# Patient Record
Sex: Male | Born: 1955
Health system: Southern US, Community
[De-identification: ages and names within clinical notes are randomized; demographics above are authoritative.]

---

## 2003-09-21 HISTORY — PX: CARDIAC SURGERY: SHX584

## 2006-10-14 ENCOUNTER — Emergency Department (HOSPITAL_COMMUNITY): Admission: EM | Admit: 2006-10-14 | Discharge: 2006-10-14 | Payer: Self-pay | Admitting: Emergency Medicine

## 2009-05-01 ENCOUNTER — Inpatient Hospital Stay (HOSPITAL_COMMUNITY): Admission: RE | Admit: 2009-05-01 | Discharge: 2009-05-04 | Payer: Self-pay | Admitting: Orthopedic Surgery

## 2009-12-25 ENCOUNTER — Encounter: Admission: RE | Admit: 2009-12-25 | Discharge: 2009-12-25 | Payer: Self-pay | Admitting: Orthopedic Surgery

## 2010-07-25 IMAGING — RF DG LUMBAR SPINE COMPLETE 4+V
1 series · 4 of 4 positions shown · non-contrast
Comparison: 04/28/2009

CLINICAL DATA: Foraminal stenosis with advanced arthritis and
radicular left leg pain, T L I F L5-S1

LUMBAR SPINE - COMPLETE 4+ VIEW

[Series 1: run · 4 of 4 slices shown]
[im 1/4]
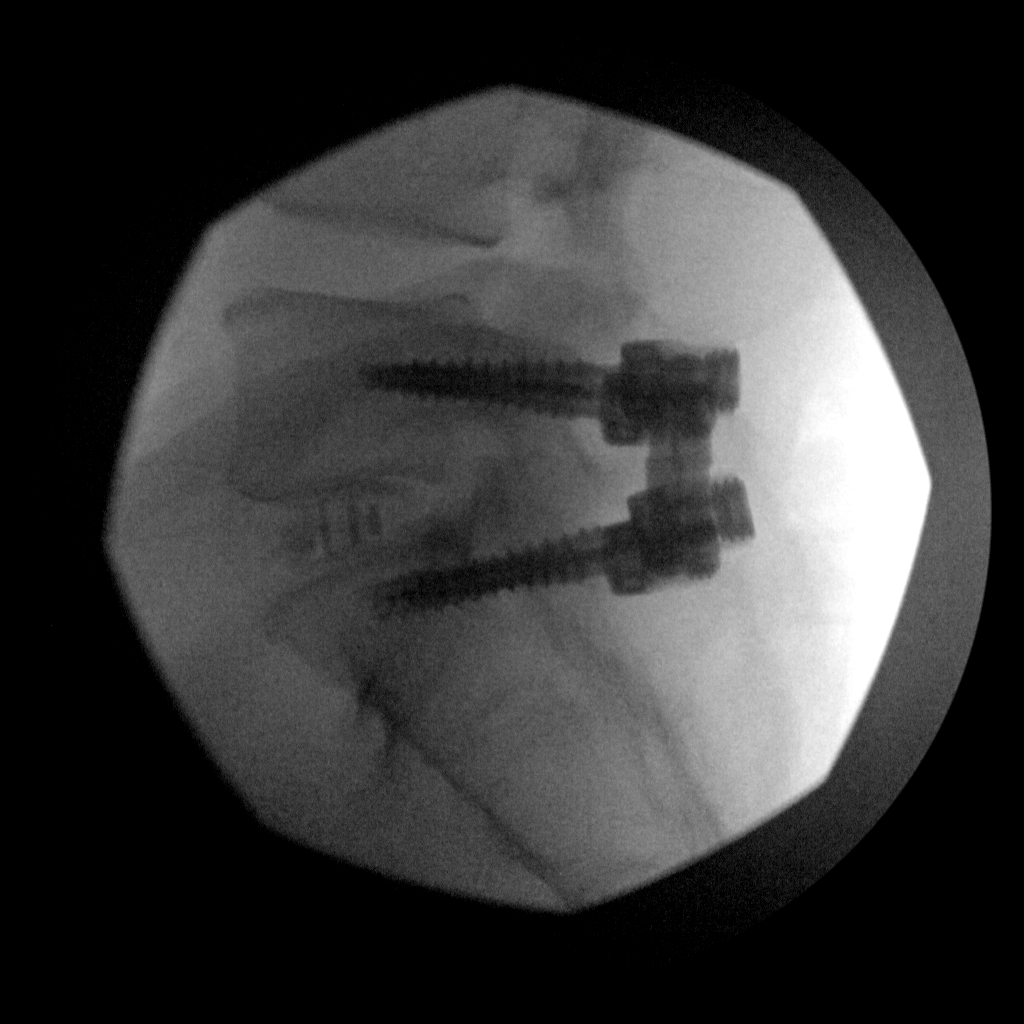
[im 2/4]
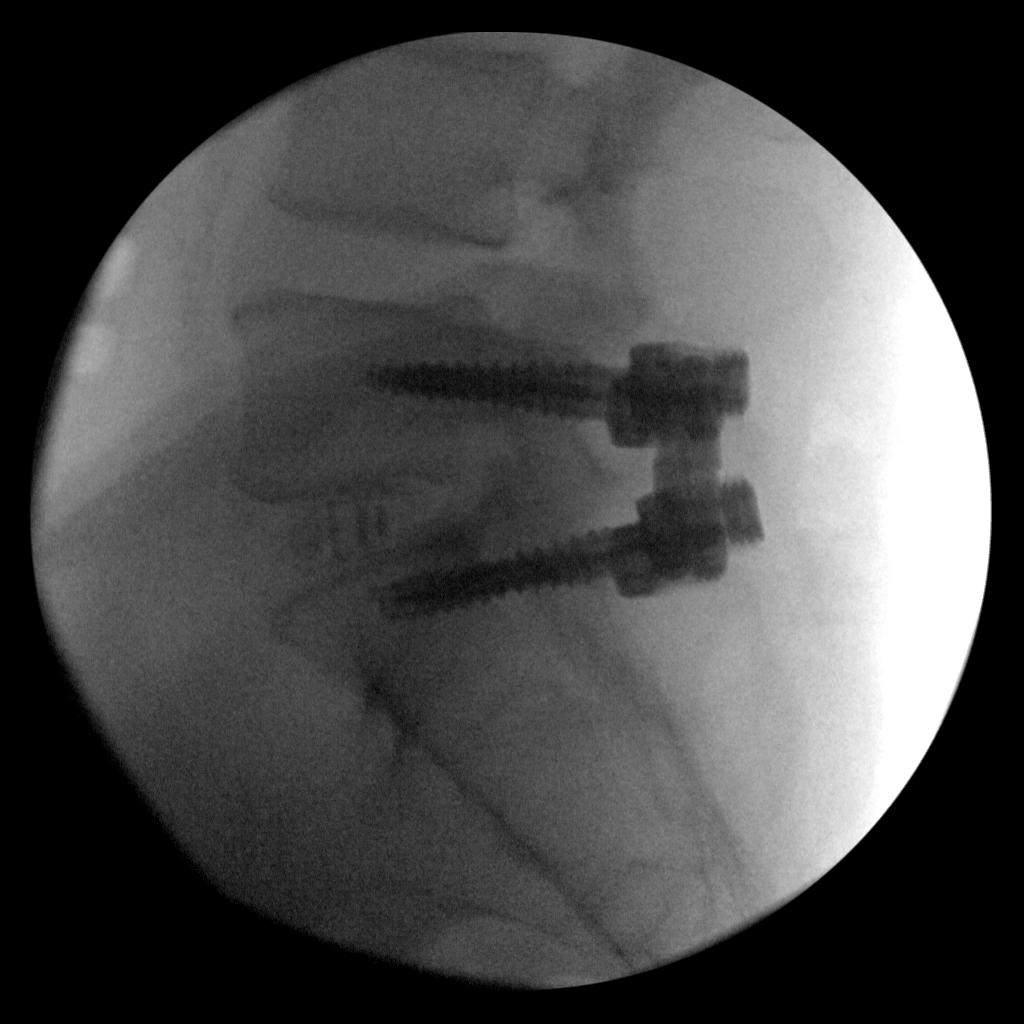
[im 3/4]
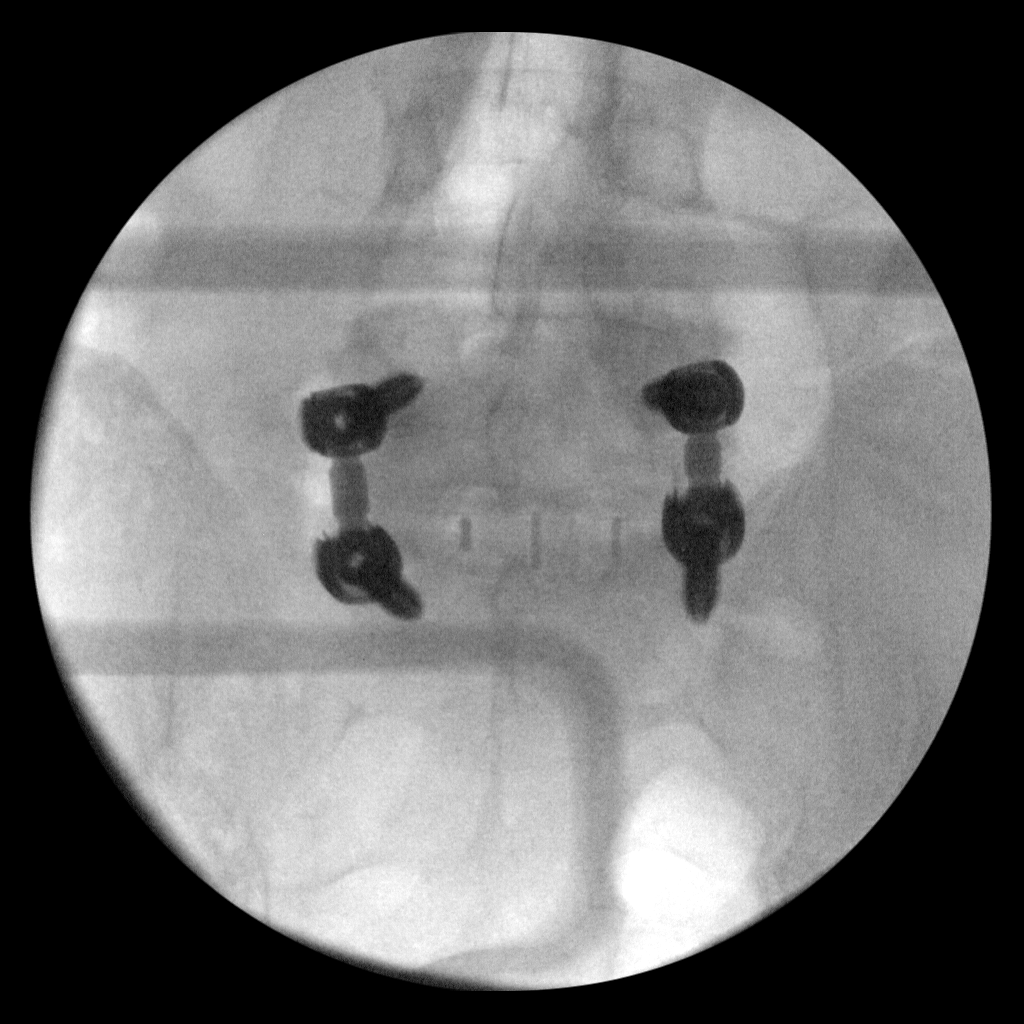
[im 4/4]
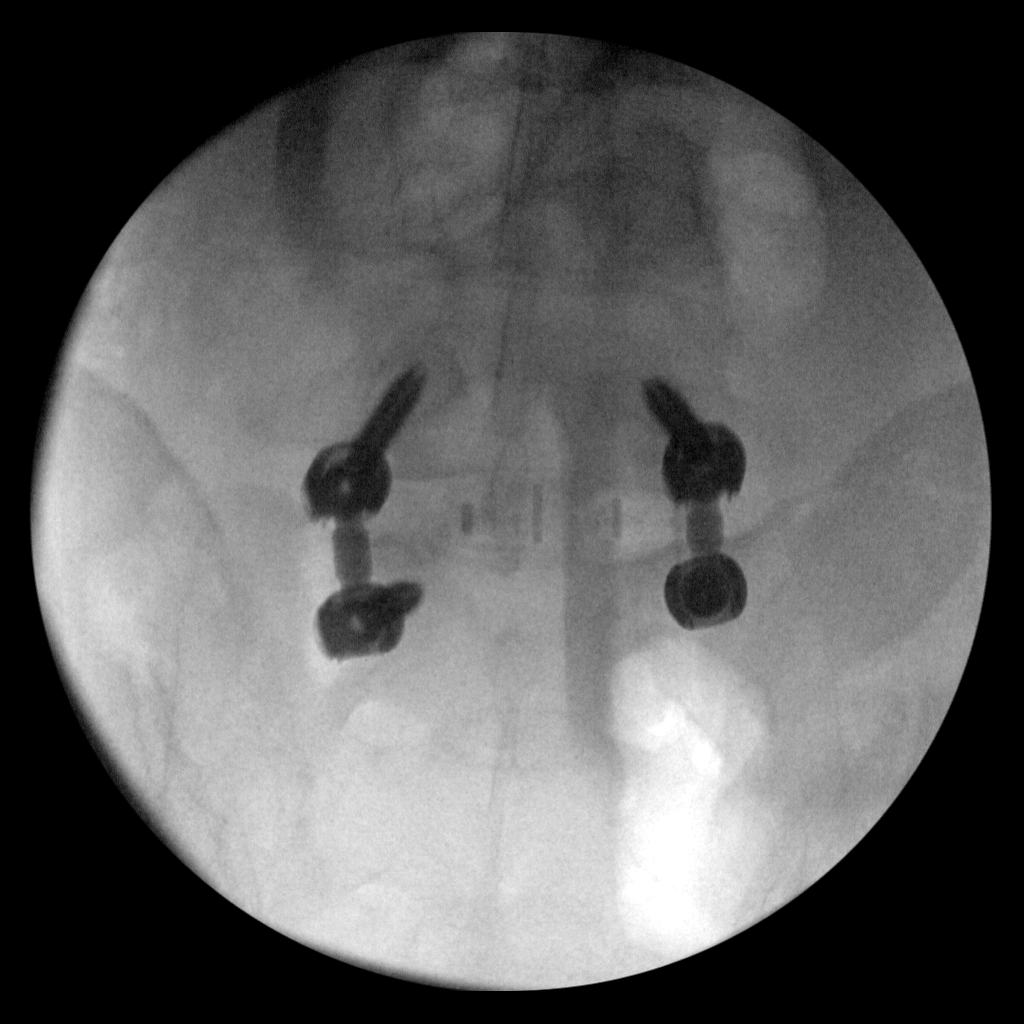

[4 of 4 positions shown; findings below may reference images not displayed]

FINDINGS: Four digital C-arm fluoroscopic images submitted.
Five lumbar vertebrae by preceding plain films of 04/28/2009.
Intraoperative images demonstrate bilateral pedicle screw and
posterior bar placement with disc prosthesis at L5-S1.
No subluxation or acute complication identified.
IMPRESSION: Status post posterior fusion L5-S1.

## 2010-07-26 IMAGING — CR DG LUMBAR SPINE 2-3V
2 series · 2 of 2 positions shown · non-contrast
Comparison: 05/01/2009 intraoperative views.

CLINICAL DATA: Foraminal stenosis with advanced arthritis and
radiculopathy left leg.

LUMBAR SPINE - 2-3 VIEW

[t l-spine a.p.]
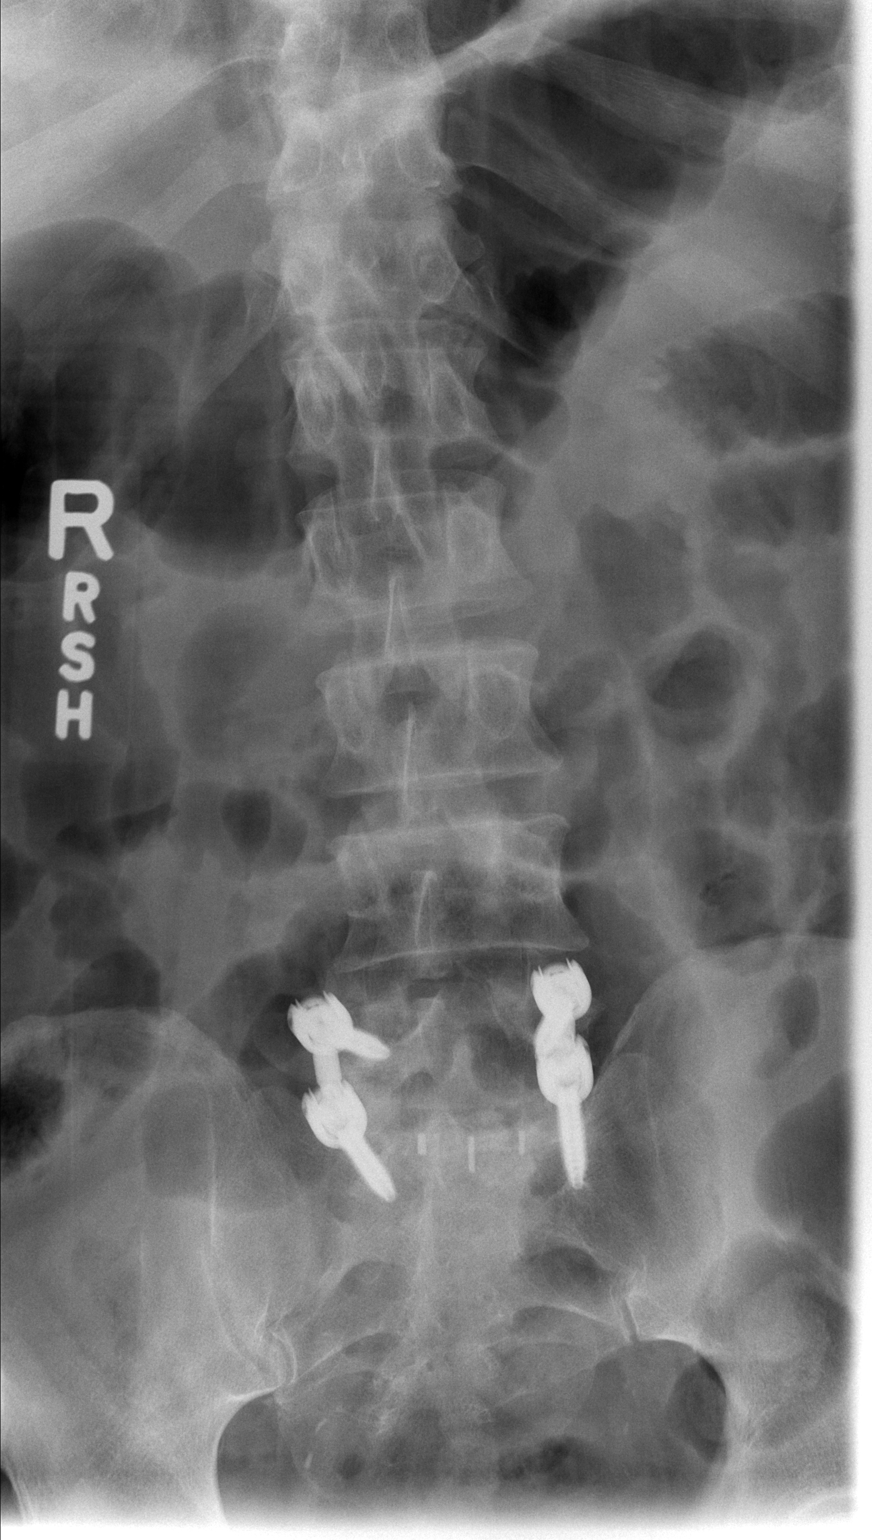

[t l-spine lat]
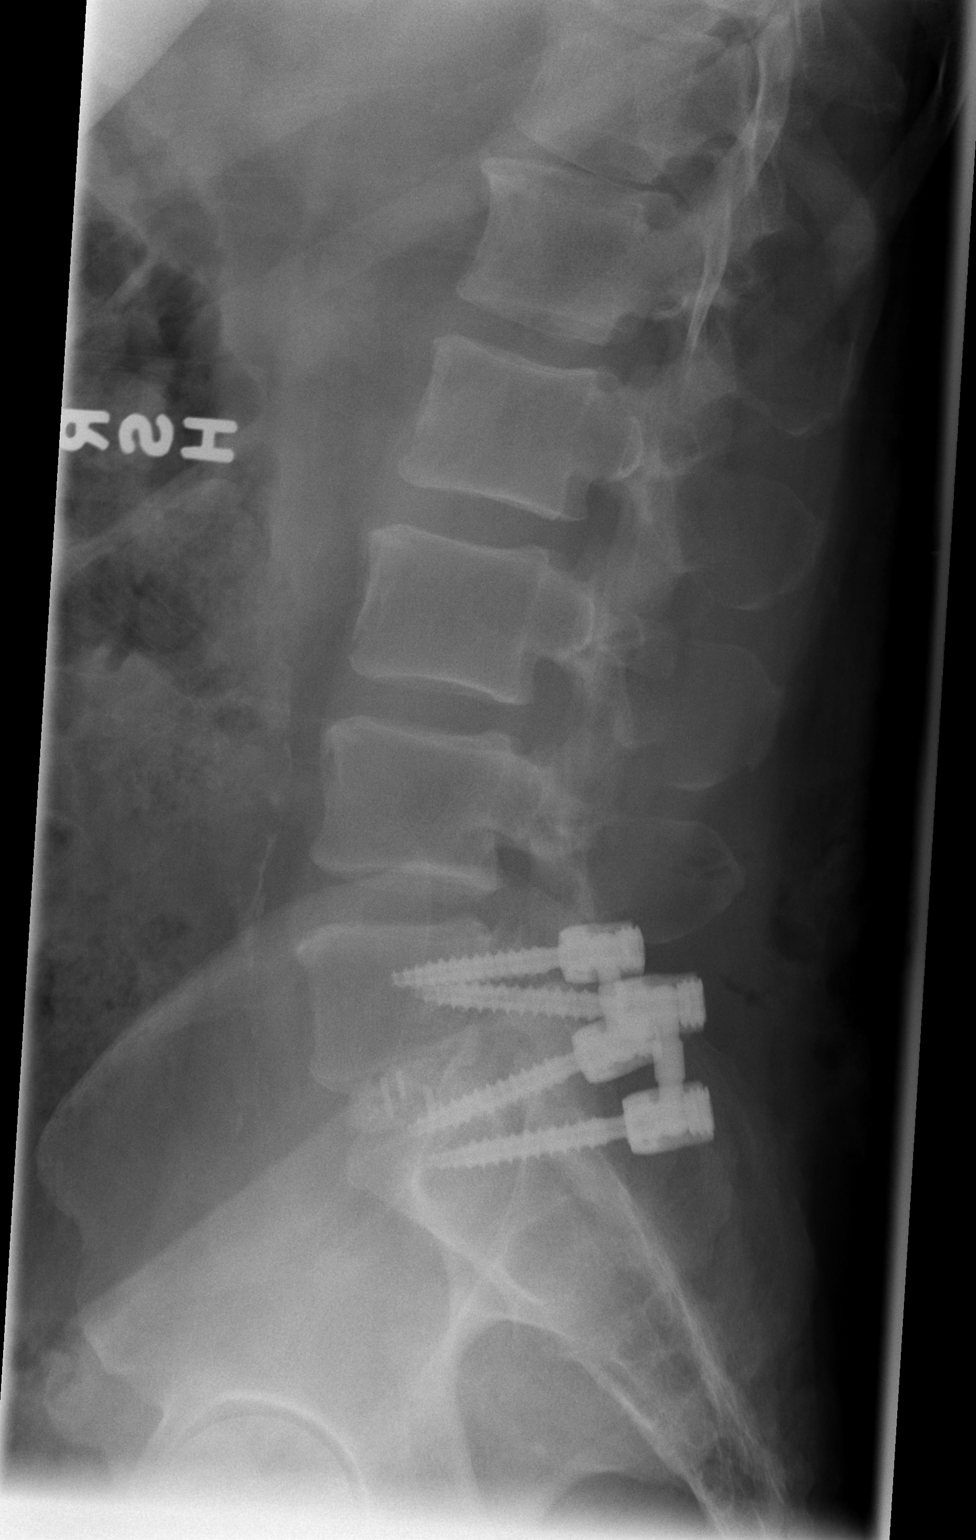

[2 of 2 positions shown; findings below may reference images not displayed]

FINDINGS: Intact paired Transpedicle screws at L5 and S1.
Posterior interconnecting bars intact.  Interbody spacers are
noted.  No spondylolisthesis.
IMPRESSION: Satisfactory postoperative changes at L5-S1.

## 2010-12-26 LAB — CBC
HCT: 40.6 % (ref 39.0–52.0)
HCT: 42.1 % (ref 39.0–52.0)
HCT: 52.4 % — ABNORMAL HIGH (ref 39.0–52.0)
Hemoglobin: 14.8 g/dL (ref 13.0–17.0)
Hemoglobin: 15 g/dL (ref 13.0–17.0)
Hemoglobin: 18 g/dL — ABNORMAL HIGH (ref 13.0–17.0)
MCHC: 34.4 g/dL (ref 30.0–36.0)
MCHC: 34.7 g/dL (ref 30.0–36.0)
MCHC: 34.8 g/dL (ref 30.0–36.0)
MCV: 92.3 fL (ref 78.0–100.0)
Platelets: 103 10*3/uL — ABNORMAL LOW (ref 150–400)
Platelets: 109 10*3/uL — ABNORMAL LOW (ref 150–400)
RBC: 4.62 MIL/uL (ref 4.22–5.81)
RBC: 5.76 MIL/uL (ref 4.22–5.81)
RDW: 13.3 % (ref 11.5–15.5)
RDW: 13.5 % (ref 11.5–15.5)
RDW: 13.7 % (ref 11.5–15.5)
WBC: 10.3 10*3/uL (ref 4.0–10.5)
WBC: 5.6 10*3/uL (ref 4.0–10.5)
WBC: 7.6 10*3/uL (ref 4.0–10.5)
WBC: 8.6 10*3/uL (ref 4.0–10.5)

## 2010-12-26 LAB — BASIC METABOLIC PANEL
BUN: 10 mg/dL (ref 6–23)
BUN: 9 mg/dL (ref 6–23)
CO2: 28 mEq/L (ref 19–32)
Calcium: 9.9 mg/dL (ref 8.4–10.5)
Chloride: 102 mEq/L (ref 96–112)
Creatinine, Ser: 1.02 mg/dL (ref 0.4–1.5)
Creatinine, Ser: 1.04 mg/dL (ref 0.4–1.5)
Creatinine, Ser: 1.18 mg/dL (ref 0.4–1.5)
GFR calc Af Amer: >60 mL/min (ref >60)
GFR calc Af Amer: >60 mL/min (ref >60)
GFR calc non Af Amer: >60 mL/min (ref >60)
GFR calc non Af Amer: >60 mL/min (ref >60)
GFR calc non Af Amer: >60 mL/min (ref >60)
Glucose, Bld: 102 mg/dL — ABNORMAL HIGH (ref 70–99)
Potassium: 3.4 mEq/L — ABNORMAL LOW (ref 3.5–5.1)
Sodium: 136 mEq/L (ref 135–145)

## 2010-12-26 LAB — TYPE AND SCREEN

## 2011-02-02 NOTE — Op Note (Signed)
NAME:  Marco Franco, Marco Franco                  ACCOUNT NO.:  [id number]   MEDICAL RECORD NO.:  [id number]          PATIENT TYPE:  INP   LOCATION:  5005                         FACILITY:  [facility]   PHYSICIAN:  Alvy Beal, MD    DATE OF BIRTH:  06/05/1956   DATE OF PROCEDURE:  DATE OF DISCHARGE:                               OPERATIVE REPORT   PREOPERATIVE DIAGNOSIS:  Degenerative disk disease with radicular left  leg pain.   POSTOPERATIVE DIAGNOSIS:  Degenerative disk disease with radicular left  leg pain.   OPERATIVE PROCEDURE:  Transforaminal lumbar interbody fusion L5-S1.  This is a minimally invasive TLIF with NuVasive minimally invasive  pedicle screw system with interbody fixation.   I used local autograft bone combined with Actifuse.  I used a 10 x 11  interbody spacer with 40-mm pedicle screws.  Intraoperative evoked motor  potentials and running EMGs remained normal throughout the case, no  electrodiagnostic or radiographic evidence of pedicle breach.   FIRST ASSISTANT:  Crissie Reese, PA   HISTORY:  This is a very pleasant 55 year old gentleman who presents  with severe back pain, buttock pain and left leg pain.  Prolonged  attempts at conservative management consisting of physical therapy,  injection therapy, activity modification have failed to alleviate his  symptoms.  Discussion was made to go for a surgical intervention and he  elected to proceed with surgery.  All appropriate risks, benefits and  alternatives to surgery were discussed with the patient and consent was  obtained.   OPERATIVE NOTE:  The patient was brought to the operating room and  placed supine on the operating table.  After successful induction of  general anesthesia and endotracheal intubation, TEDs, SCD and a Foley  were applied.  The patient was turned prone onto a Wilson frame and all  bony prominences were well-padded.  The NuVasive intraoperative neuro-  monitoring devices were attached  (needle electrodes).  After they were  all secured down a test was done and they were working satisfactory.  The back was prepped and draped in a standard fashion.   An x-ray was brought into the wound sterilely and identified the left  lateral aspect of the L5 pedicle.  A stab incision was made and the  trocar was advanced percutaneously down to the lateral aspect of the  facet complex.  I confirmed satisfactory position in the AP and lateral  planes.  I then advanced the pedicle probe through the pedicle just at  the posterior vertebral body junction.  I confirmed satisfactory  trajectory again using AP and lateral fluoroscopy.  I then connected it  to the neuro-monitoring device and using free running EMGs at no point  were there any electrodiagnostic evidence of pedicle breach.  I advanced  the pedicle probe through the posterior vertebral body and halfway into  the vertebral body.  I then removed out the inner sleeve and placed a  guidepin down the trocar and into the vertebral body.  I then removed  the probe entirety.  At this point, I rechecked the x-ray to ensure the  pin did not migrate.  I proceeded with an S1 screw.  Using the same  technique, I made a stab incision on the lateral aspect of the S1  pedicle and advanced the trocar down right at the lateral border of the  facet complex.  Confirming position in the AP and lateral planes, I  advanced the pedicle probe again using free running real time EMG  evaluation through the S1 pedicle.  Just as I was at the junction of the  S1 pedicle and posterior vertebral body of S1, I checked the AP view to  confirm that I had not breached medially, confirmed, I advanced into the  S1 vertebral body.  I then removed the inner sleeve, placed the guidepin  and removed the outer sleeve.  At this point with both sleeves  positioned, I made an incision connecting these two guidepins.  I  sharply dissected down to the deep fascia and that was  sharply incised.   At this point, I advanced the dilating tubes down the guidepin and then  placed the tap.  Again using the free running live EMGs, I advanced the  tap and at no point was there any EMG/neurodiagnostic evidence of  pedicle breach.  Again confirming position and trajectory in the AP and  lateral planes, there was no radiographic evidence of breach.  I then  repeated this at S1 and then placed a screw to the appropriate depth.  Attached to the screw was the side retractor.  At this point with the  screws at appropriate depth and  side retractors, I secured the external  retracting device to the blades.  I then secured this to the table.  I  then distracted the pedicle screws and I had adequate distraction of the  L5-S1 disk space.  I then placed the medial blade into the correct  position.  At this point, I had an adequate exposure to the lateral  aspect of the L5-S1 joint space.   At this point in time, I identified the posterior aspect of the S1  lamina and used a fine curette to develop a plane underneath it.  I then  did a complete S1 laminectomy and a partial L5 laminectomy.  At this  point, I could then get a nerve hook underneath the ligamentum flavum  and I resected the ligamentum flavum to expose the lateral aspect of the  thecal sac.  At this point, I used an osteotome to remove the inferior  L5 facet complex.  This was morselized for later bone graft use.   With the inferior L5 facet removed, I then used a 3-mm Kerrison to  resect the superior and medial portion of the S1 superior facet complex.  I then continued my dissection out laterally removing the pars intra-  articularis and unroofing the exiting L5 nerve root.  I then carried my  dissection superiorly until I could palpate the inferior wall of the L5  pedicle.  At this point, I could clearly visualize from L5 pedicle to S1  pedicle.  I also was able to directly visualize the traversing S1 nerve  root,  as well as the exiting L5 nerve root.  Both were adequately  decompressed.   At this point, I noted there to be significantly large epidural veins  which I was able to coagulate using the bipolar electrocautery.  I then  was able to freely sweep the thecal sac medially and protected it with a  D'Errico.   At  this point in time with the approach complete, I then incised the  annulus with a 15 blade scalpel and then using a combination of  pituitary rongeurs, curettes and Kerrison rongeurs I performed a  complete diskectomy of L5-S1.  I used downgoing curette to scrape off  any remaining subchondral bone.  I also used the side scraping curettes  to better prep the endplates.  Once I had adequate endplate preparation,  I measured the interbody space and elected to use a size 10 x 11  interbody PEEK spacer.  At this point, I took the graft.  I irrigated  copiously the wound and then took the morselized autograft bone impacted  into the anterior aspect of the disk space.  I mixed this also with  Actifuse.  With the anterior sentinel graft positioned, I then packed it  down with a small bone tamp.  I then took the interbody graft, packed  with Actifuse and local bone and malleted it to the appropriate depth.  I then was able to directly evaluate it and it was adequately positioned  in the anterior third of the vertebral body.  At this point in time with  the interbody device secured, I removed the distraction and tested the  interbody device to ensure it was stable.  Confirmed I then removed the  shins and connected the blades to the screw and then advanced the screw  head down to the appropriate depth.  At this point, I then placed the  screw heads back on.  The polyaxial heads were secured to the screw and  checked.  I then took a 20-mm rod, secured it into the space and locked  it into position.  I then irrigated copiously with normal saline and  placed thrombin-soaked Gelfoam over the  exposed thecal sac and then  closed the deep fascia with interrupted 0 Vicryl suture, superficial  with 2-0 Vicryl sutures and the skin  with 3-0 Monocryl.  With the TLIF  and left-sided fusion complete, I then proceeded to the contralateral  side for the percutaneous screw insertion.   Using the same pedicle technique I used on the left side, I  percutaneously placed the L5-S1 pedicle screws.  Again this was actually  done through small stab incisions confirming trajectory of the trocar in  the AP and lateral planes and using live real-time EMG nerve conduction  to confirm that there was no pedicle breach.  I then tapped again using  fluoro and the electrodiagnostic device and placed the screw.  Once both  screws were positioned, I made a single incision connecting to stab  incision and cutting the fascia in between the two screws.  I advanced  the rod down to the appropriate depth and secured the rod to the screw  with the end cap.  I then torqued them appropriately, irrigated the  wound copiously with normal saline and removed the targeting devices.  I  then irrigated this wound copiously with normal saline, closed the deep  fascia with interrupted 0 Vicryl sutures, superficial with 2-0 and 3-0  Monocryl for the skin.  Steri-Strips and dry dressing were applied.  The  patient was extubated and transferred to PACU without incident.  At the  end of the case, all needle and sponge counts were correct.  Estimated  blood loss 750 mL with 325 mL returned by Cell Saver.  Should be noted  that prior to making incision an appropriate time-out was done  confirming patient, procedure  and the extremity that was symptomatic.      Alvy Beal, MD  Electronically Signed     DDB/MEDQ  D:  05/01/2009  T:  05/02/2009  Job:  [id number]

## 2011-03-20 IMAGING — RF DG MYELOGRAM LUMBAR
13 of 16 series · 13 of 16 positions shown · non-contrast
Comparison: none

CLINICAL DATA: Recurrent low back pain status post L5-S1 fusion.
TECHNIQUE: Contiguous axial images were obtained through the lumbar
spine without infusion. Coronal, sagittal, and disc space
reconstructions were obtained of the axial image sets.

[Series 1: (hospital) · 1 of 1 slices shown]
[im 1/1]
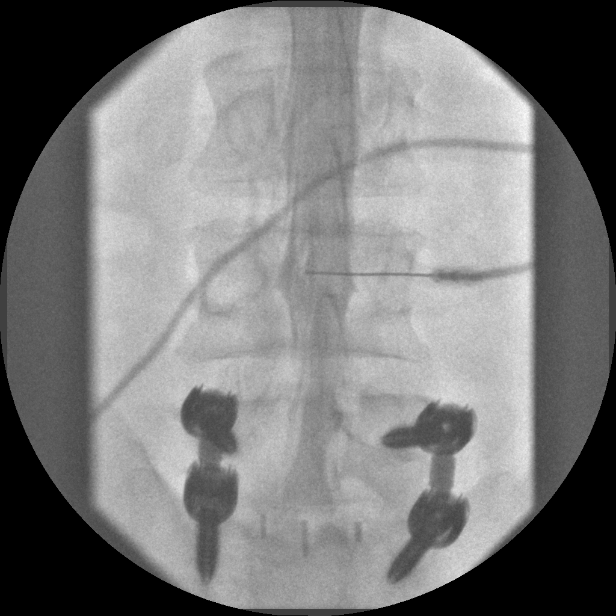

[Series 2: myelogram  white · 1 of 1 slices shown (1 of 12)]
[im 1/1]
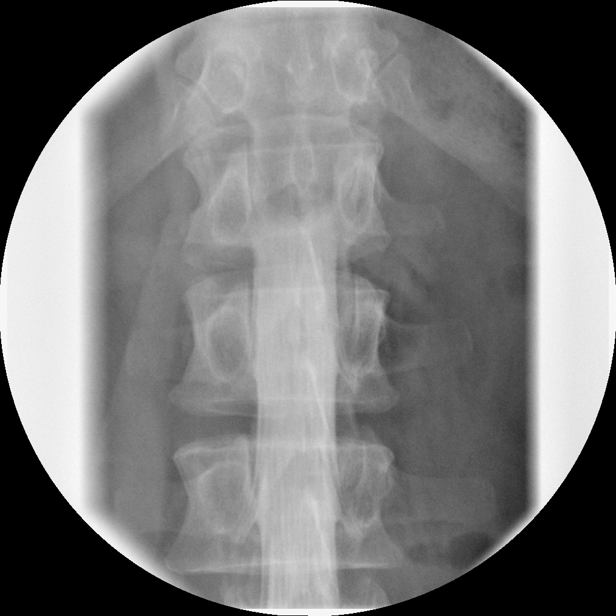

[Series 4: myelogram  white · 1 of 1 slices shown (2 of 12)]
[im 1/1]
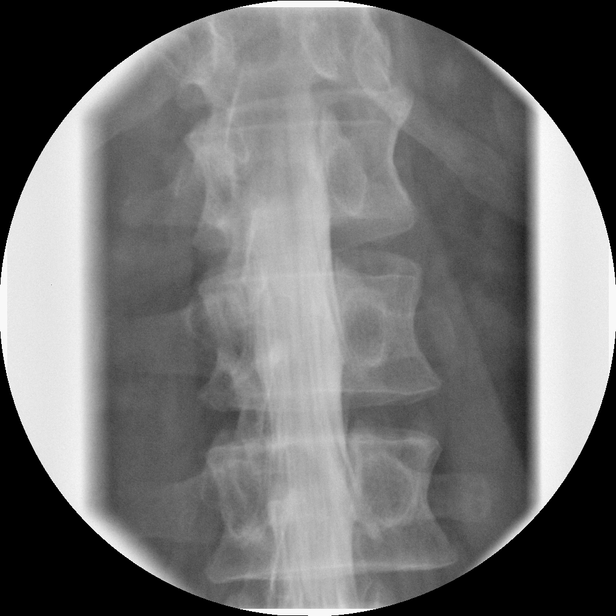

[Series 5: myelogram  white · 1 of 1 slices shown (3 of 12)]
[im 1/1]
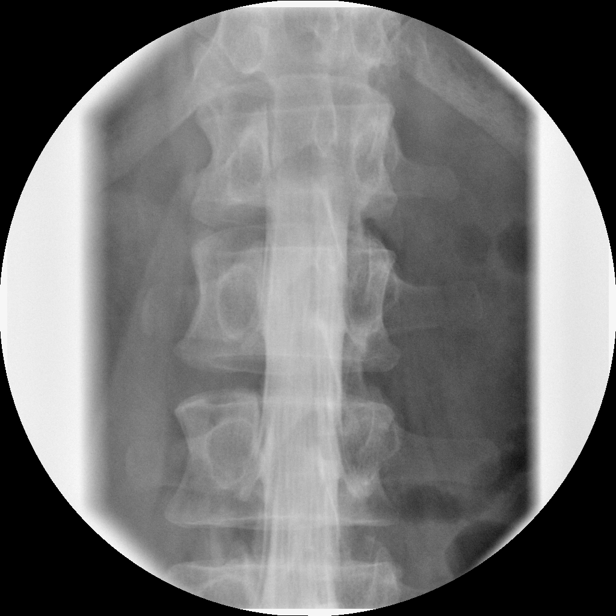

[Series 6: myelogram  white · 1 of 1 slices shown (4 of 12)]
[im 1/1]
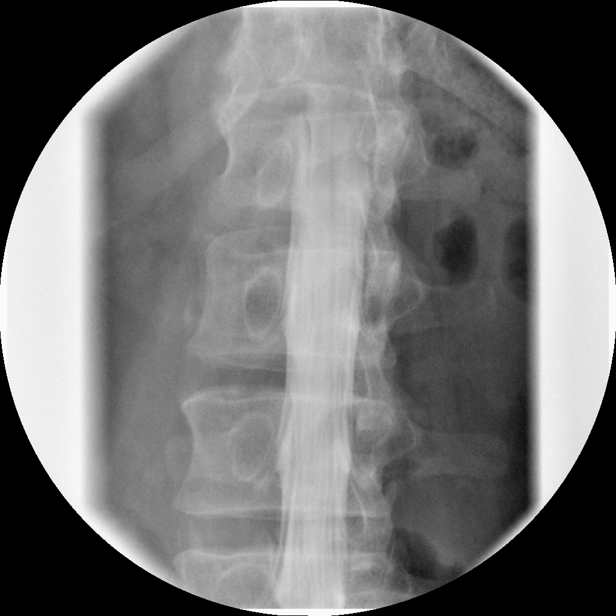

[Series 7: myelogram  white · 1 of 1 slices shown (5 of 12)]
[im 1/1]
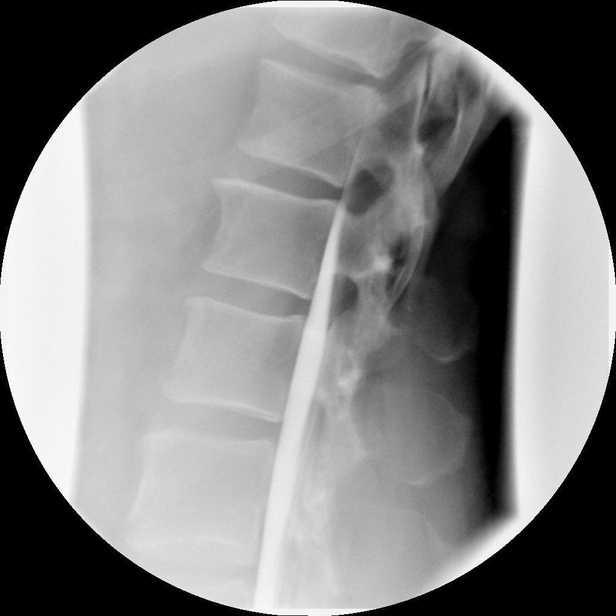

[Series 9: myelogram  white · 1 of 1 slices shown (6 of 12)]
[im 1/1]
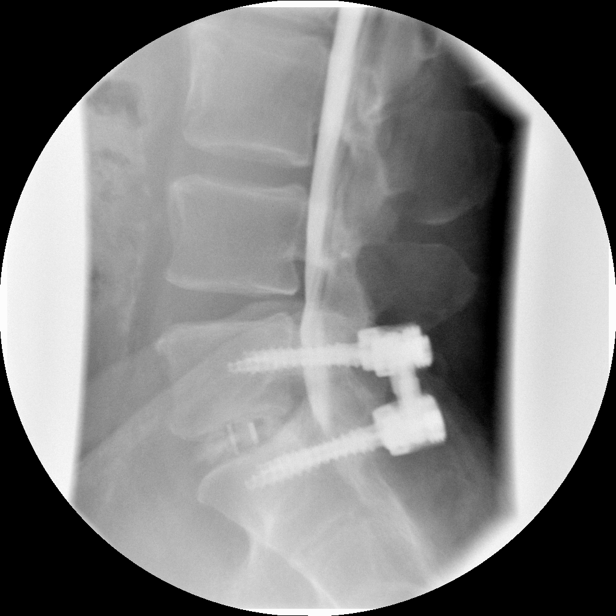

[Series 10: myelogram  white · 1 of 1 slices shown (7 of 12)]
[im 1/1]
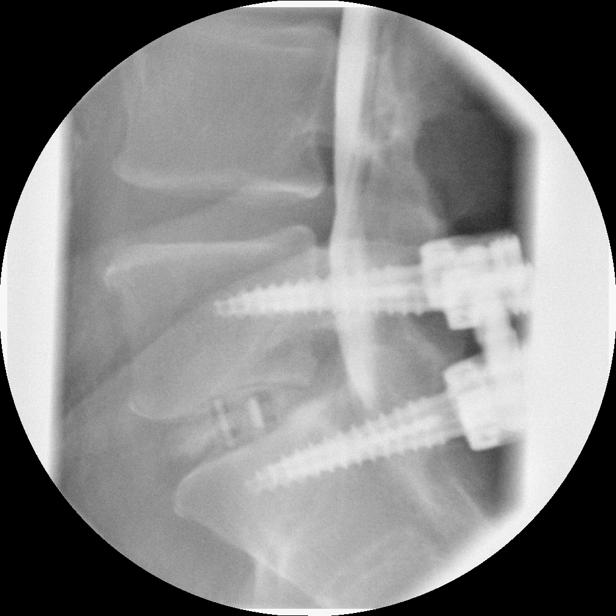

[Series 11: myelogram  white · 1 of 1 slices shown (8 of 12)]
[im 1/1]
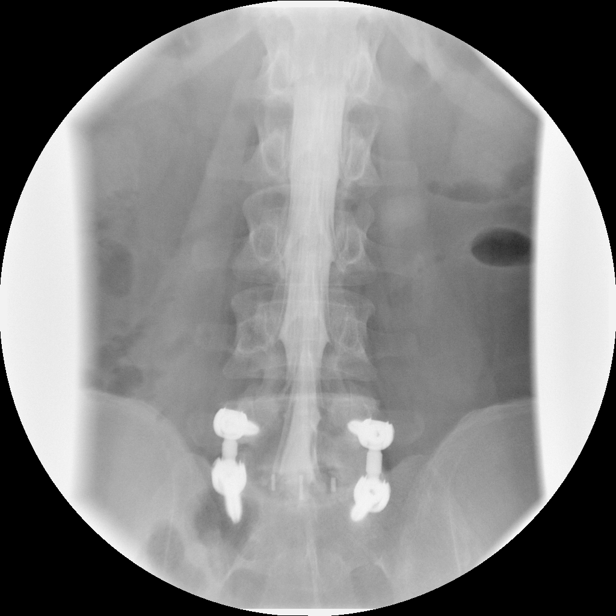

[Series 12: myelogram  white · 1 of 1 slices shown (9 of 12)]
[im 1/1]
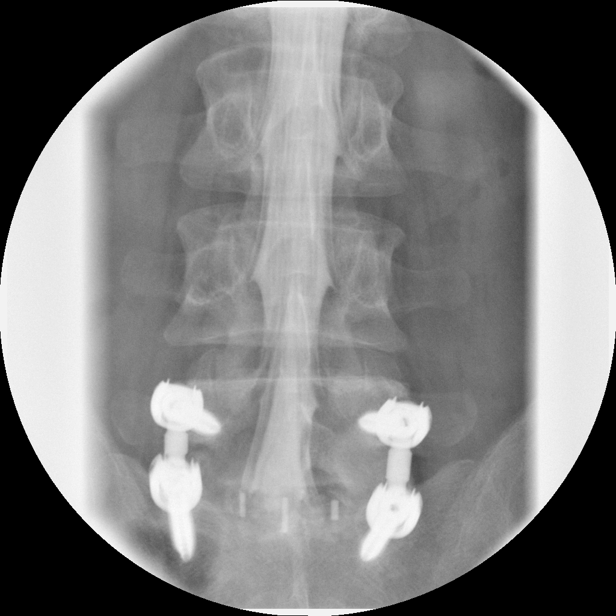

[Series 13: myelogram  white · 1 of 1 slices shown (10 of 12)]
[im 1/1]
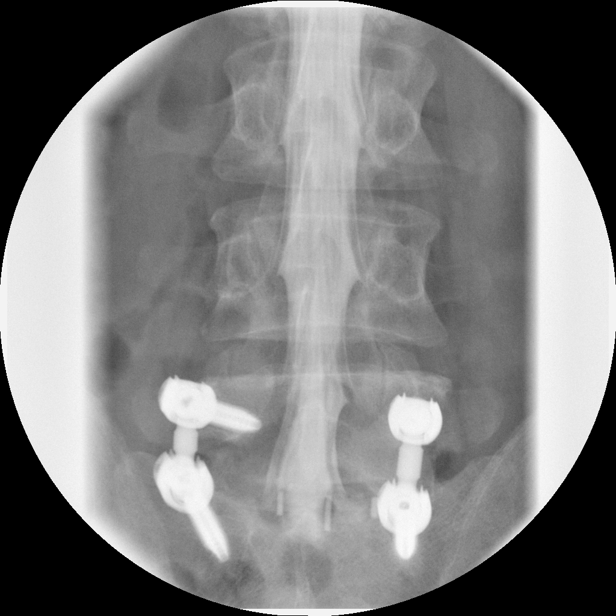

[Series 15: myelogram  white · 1 of 1 slices shown (11 of 12)]
[im 1/1]
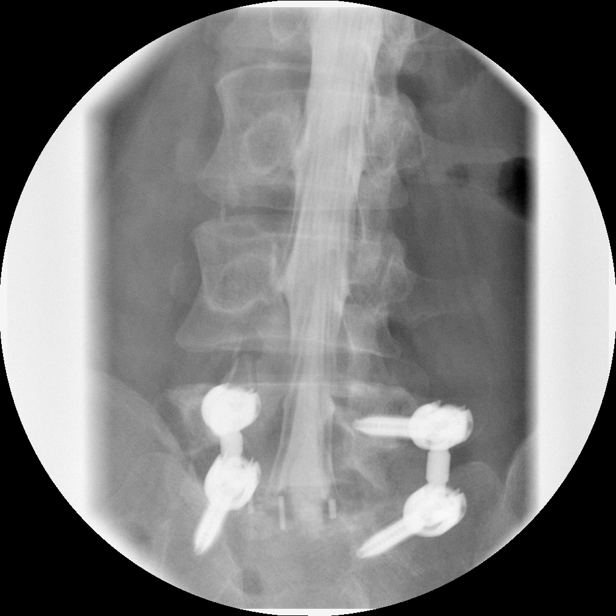

[Series 16: myelogram  white · 1 of 1 slices shown (12 of 12)]
[im 1/1]
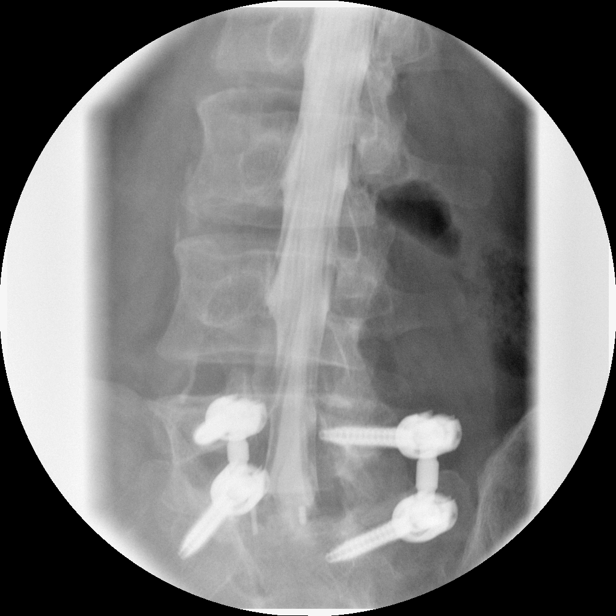

[13 of 16 positions shown; findings below may reference images not displayed]

LUMBAR MYELOGRAM

Procedure: After thorough discussion of risks and benefits of the
procedure including bleeding, infection, injury to nerves, blood
vessels, adjacent structures as well as headache and CSF leak,
written and oral informed consent was obtained.   Consent was
obtained by Dr.Brundage.

Patient was positioned prone on the fluoroscopy table. Local
anesthesia was provided with 1% lidocaine without epinephrine after
prepped and draped in the usual sterile fashion. Puncture was
performed at L3-L4 using a 3-1/2 inches 22-gauge spinal needle via
right paramedian approach.  Using a single pass through the dura,
the needle was placed within the thecal sac, with return of clear
CSF. 15 mL of Fmnipaque-9TR was injected into the thecal sac, with
normal opacification of the nerve roots and cauda equina consistent
with free flow within the subarachnoid space.

Fluoroscopy time: 1 minute 22 seconds
FINDINGS: The alignment of the lumbar spine is anatomic.  With
flexion and extension maneuvers, there is no evidence of motion at
the fused level.  No displacement of interbody spacer material.
Mild disc space loss is present at L4-L5.  There is no amputation
of nerve roots.  Anterior extradural impression is present at L4-L5
consistent with bulging disc.  No evidence of hardware loosening or
failure.
IMPRESSION: 1.  Successful lumbar puncture for lumbar myelogram.
2.  L5-S1 posterior lumbar interbody fusion without complicating
features.
3.  Mild L4-L5 degenerative disc disease with anterior impression
on the thecal sac.

CT LUMBAR MYELOGRAM
FINDINGS: The alignment of the lumbar spine is anatomic.  Spinal
cord terminates posterior to the L1 level.  Posterior rod and
pedicle screw fixation with discectomy is present at L5-S1.
Abdominal aortic atherosclerosis is present which is partially
visualized.  No aneurysm.

T12-L1:  Negative.
L1-L2:  Negative.
L2-L3:  Calcification of the anterior longitudinal ligament.  No
stenosis.
L3-L4:  Congenitally short pedicles are noted in the lower lumbar
spine.  Despite short pedicles, there is no significant stenosis.
Central canal, lateral recesses and foramina appear patent.  No
disc protrusion.
L4-L5:  Short pedicles again noted.  There is a shallow broad-based
posterior disc bulge with mild impression on the thecal sac.  No
facet hypertrophy.  Mild ligamentum flavum redundancy.  Central
canal and lateral recesses appear adequately patent.  Both neural
foramina appear patent.
L5-S1:  Discectomy with no bridging bone across the L5-S1 disc
space.  Left laminectomy and facetectomy.  Epidural fibrosis is
present however there is no foraminal, central, or lateral recess
stenosis.  Small amount of soft tissue density is present anterior
to the thecal sac consistent with epidural fibrosis.  SI joints
appear within normal limits.
IMPRESSION: 1.  Uncomplicated L5-S1 posterior lumbar interbody fusion with left
laminectomy and facetectomy.  No recurrent or residual stenosis.
No bridging bone is present across the disc space.  No hardware
displacement.
2.  L4-L5 broad-based shallow disc bulge without resulting
stenosis.  Congenitally short pedicles are present throughout the
lumbar spine.  No compressive lesions are identified.

## 2017-05-19 ENCOUNTER — Encounter: Payer: Self-pay | Admitting: Emergency Medicine

## 2017-05-19 ENCOUNTER — Emergency Department
Admission: EM | Admit: 2017-05-19 | Discharge: 2017-05-19 | Disposition: A | Discharge status: Home / Self Care | Payer: Medicare HMO | Attending: Emergency Medicine | Admitting: Emergency Medicine

## 2017-05-19 DIAGNOSIS — E860 Dehydration: Secondary | ICD-10-CM | POA: Insufficient documentation

## 2017-05-19 DIAGNOSIS — T675XXA Heat exhaustion, unspecified, initial encounter: Secondary | ICD-10-CM | POA: Insufficient documentation

## 2017-05-19 DIAGNOSIS — Z87891 Personal history of nicotine dependence: Secondary | ICD-10-CM | POA: Insufficient documentation

## 2017-05-19 DIAGNOSIS — R531 Weakness: Secondary | ICD-10-CM | POA: Diagnosis present

## 2017-05-19 DIAGNOSIS — Z79899 Other long term (current) drug therapy: Secondary | ICD-10-CM | POA: Diagnosis not present

## 2017-05-19 LAB — CK: CK TOTAL: 117 U/L (ref 49–397)

## 2017-05-19 LAB — BASIC METABOLIC PANEL
ANION GAP: 10 (ref 5–15)
ANION GAP: 9 (ref 5–15)
BUN: 21 mg/dL — ABNORMAL HIGH (ref 6–20)
BUN: 19 mg/dL (ref 6–20)
CALCIUM: 9.2 mg/dL (ref 8.9–10.3)
CALCIUM: 8.9 mg/dL (ref 8.9–10.3)
CO2: 26 mmol/L (ref 22–32)
CO2: 26 mmol/L (ref 22–32)
CREATININE: 1.48 mg/dL — AB (ref 0.61–1.24)
Chloride: 100 mmol/L — ABNORMAL LOW (ref 101–111)
Chloride: 102 mmol/L (ref 101–111)
Creatinine, Ser: 1.52 mg/dL — ABNORMAL HIGH (ref 0.61–1.24)
GFR calc Af Amer: 57 mL/min — ABNORMAL LOW (ref >60)
GFR calc non Af Amer: 47 mL/min — ABNORMAL LOW (ref >60)
GFR, EST AFRICAN AMERICAN: 55 mL/min — AB (ref >60)
GFR, EST NON AFRICAN AMERICAN: 49 mL/min — AB (ref >60)
GLUCOSE: 108 mg/dL — AB (ref 65–99)
Glucose, Bld: 113 mg/dL — ABNORMAL HIGH (ref 65–99)
POTASSIUM: 3.6 mmol/L (ref 3.5–5.1)
Potassium: 3.7 mmol/L (ref 3.5–5.1)
Sodium: 136 mmol/L (ref 135–145)
Sodium: 137 mmol/L (ref 135–145)

## 2017-05-19 LAB — CBC
HEMATOCRIT: 49.3 % (ref 40.0–52.0)
Hemoglobin: 17 g/dL (ref 13.0–18.0)
MCH: 31.2 pg (ref 26.0–34.0)
MCHC: 34.6 g/dL (ref 32.0–36.0)
MCV: 90.3 fL (ref 80.0–100.0)
PLATELETS: 122 10*3/uL — AB (ref 150–440)
RBC: 5.46 MIL/uL (ref 4.40–5.90)
RDW: 13.6 % (ref 11.5–14.5)
WBC: 9.4 10*3/uL (ref 3.8–10.6)

## 2017-05-19 LAB — TROPONIN I: Troponin I: <0.03 ng/mL (ref <0.03)

## 2017-05-19 MED ORDER — SODIUM CHLORIDE 0.9 % IV BOLUS (SEPSIS)
500.0000 mL | Freq: Once | INTRAVENOUS | Status: AC
  Administered 2017-05-19: 500 mL via INTRAVENOUS

## 2017-05-19 NOTE — ED Provider Notes (Signed)
[facility] Emergency Department Provider Note  ____________________________________________  Time seen: Approximately 3:36 PM  I have reviewed the triage vital signs and the nursing notes.   HISTORY  Chief Complaint Weakness   HPI Koda Routon is a 61 y.o. male history of coronary artery disease and presents for evaluation of generalized weakness. Patient reports that he spent 7 hours [place] weather. He had 2 bottles of water and 2 of Gatorade. All of the sudden he started feeling generalized weakness, nauseated, and dry heaving. A friend was with him and drove him to the EMS station. Patient was diaphoretic and very hot to the touch per EMS. He was started on cold IV saline. Blood glucose was normal. The patient received 250 cc in route and reports that he feels back to normal at this time. He reports that his only meal today was breakfast before going out in the field. He denies chest pain or palpitations, shortness of breath, vomiting or diarrhea, abdominal pain, facial droop, slurred speech, unilateral weakness or numbness.  History reviewed. No pertinent past medical history.  There are no active problems to display for this patient.   Past Surgical History:  Procedure Laterality Date  . CARDIAC SURGERY  2005    Prior to Admission medications   Medication Sig Start Date End Date Taking? Authorizing Provider  colchicine 0.6 MG tablet Take 1 tablet by mouth daily. 04/04/17  Yes [provider]  doxycycline (VIBRAMYCIN) 100 MG capsule Take 1 capsule by mouth daily. 03/15/17   [provider]    Allergies Bee venom and Codeine  History reviewed. No pertinent family history.  Social History Social History  Substance Use Topics  . Smoking status: Former Smoker    Types: Cigarettes  . Smokeless tobacco: Never Used  . Alcohol use No    Review of Systems  Constitutional: Negative for fever. +  generalized weakness Eyes: Negative for visual changes. ENT: Negative for sore throat. Neck: No neck pain  Cardiovascular: Negative for chest pain. Respiratory: Negative for shortness of breath. Gastrointestinal: Negative for abdominal pain, vomiting or diarrhea. + nausea Genitourinary: Negative for dysuria. Musculoskeletal: Negative for back pain. Skin: Negative for rash. Neurological: Negative for headaches, weakness or numbness. Psych: No SI or HI  ____________________________________________   PHYSICAL EXAM:  VITAL SIGNS: ED Triage Vitals  Enc Vitals Group     BP 05/19/17 1530 133/83     Pulse Rate 05/19/17 1530 71     Resp 05/19/17 1530 16     Temp 05/19/17 1530 98.2 F (36.8 C)     Temp Source 05/19/17 1530 Oral     SpO2 05/19/17 1530 97 %     Weight 05/19/17 1533 248 lb (112.5 kg)     Height 05/19/17 1533 5\' 10"  (1.778 m)     Head Circumference --      Peak Flow --      Pain Score --      Pain Loc --      Pain Edu? --      Excl. in GC? --     Constitutional: Alert and oriented. Well appearing and in no apparent distress. HEENT:      Head: Normocephalic and atraumatic.         Eyes: Conjunctivae are normal. Sclera is non-icteric.       Mouth/Throat: Mucous membranes are moist.       Neck: Supple with no signs of meningismus. Cardiovascular:  Regular rate and rhythm. No murmurs, gallops, or rubs. 2+ symmetrical distal pulses are present in all extremities. No JVD. Respiratory: Normal respiratory effort. Lungs are clear to auscultation bilaterally. No wheezes, crackles, or rhonchi.  Gastrointestinal: Soft, non tender, and non distended with positive bowel sounds. No rebound or guarding. Musculoskeletal: Nontender with normal range of motion in all extremities. No edema, cyanosis, or erythema of extremities. Neurologic: Normal speech and language. A & O x3, PERRL, no nystagmus, CN II-XII intact, motor testing reveals good tone and bulk throughout. There is no  evidence of pronator drift or dysmetria. Muscle strength is 5/5 throughout. Sensory examination is intact. Gait is normal. Skin: Skin is warm, dry and intact. No rash noted. Psychiatric: Mood and affect are normal. Speech and behavior are normal.  ____________________________________________   LABS (all labs ordered are listed, but only abnormal results are displayed)  Labs Reviewed  BASIC METABOLIC PANEL - Abnormal; Notable for the following:       Result Value   Chloride 100 (*)    Glucose, Bld 113 (*)    BUN 21 (*)    Creatinine, Ser 1.52 (*)    GFR calc non Af Amer 47 (*)    GFR calc Af Amer 55 (*)    All other components within normal limits  CBC - Abnormal; Notable for the following:    Platelets 122 (*)    All other components within normal limits  BASIC METABOLIC PANEL - Abnormal; Notable for the following:    Glucose, Bld 108 (*)    Creatinine, Ser 1.48 (*)    GFR calc non Af Amer 49 (*)    GFR calc Af Amer 57 (*)    All other components within normal limits  TROPONIN I  CK  BASIC METABOLIC PANEL   ____________________________________________  EKG  none ____________________________________________  RADIOLOGY  none  ____________________________________________   PROCEDURES  Procedure(s) performed: None Procedures Critical Care performed:  None ____________________________________________   INITIAL IMPRESSION / ASSESSMENT AND PLAN / ED COURSE   61 y.o. 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 Patient feels back to normal after 250cc and zofran per EMS. Neuro intact. Vitals stable. Will give total of 1L NS, check basic labs.     _________________________ 8:12 PM on 05/19/2017 -----------------------------------------  CK normal. The initial creatinine of 1.52 however we'll have another result since 2012. Patient  was given IVF and repeat creatinine trending down. Patient remains extremely well appearing, tolerating by mouth. Recommended staying out of the heat, increase by mouth hydration for the next several days and close follow-up with his nephrologist. Discussed return precautions with patient.  Pertinent labs & imaging results that were available during my care of the patient were reviewed by me and considered in my medical decision making (see chart for details).    ____________________________________________   FINAL CLINICAL IMPRESSION(S) / ED DIAGNOSES  Final diagnoses:  Heat exhaustion, initial encounter  Dehydration      NEW MEDICATIONS STARTED DURING THIS VISIT:  New Prescriptions   No medications on file     Note:  This document was prepared using Dragon voice recognition software and may include unintentional dictation errors.    Nita SickleVeronese, [place], MD 05/19/17 2013

## 2017-05-19 NOTE — ED Notes (Signed)

## 2017-05-19 NOTE — ED Triage Notes (Signed)
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 At this time, pt is awake, alert and oriented x4.

## 2017-05-19 NOTE — ED Notes (Signed)
Pt taken off [place] at 2L and monitored O2 sats; O2 sats noted to be at 98% on RA. MD notified.

## 2017-09-21 DIAGNOSIS — E7801 Familial hypercholesterolemia: Secondary | ICD-10-CM | POA: Diagnosis not present

## 2017-10-08 DIAGNOSIS — J189 Pneumonia, unspecified organism: Secondary | ICD-10-CM | POA: Diagnosis not present

## 2017-12-01 DIAGNOSIS — E7801 Familial hypercholesterolemia: Secondary | ICD-10-CM | POA: Diagnosis not present

## 2017-12-07 DIAGNOSIS — N401 Enlarged prostate with lower urinary tract symptoms: Secondary | ICD-10-CM | POA: Diagnosis not present

## 2017-12-07 DIAGNOSIS — R351 Nocturia: Secondary | ICD-10-CM | POA: Diagnosis not present

## 2017-12-07 DIAGNOSIS — R339 Retention of urine, unspecified: Secondary | ICD-10-CM | POA: Diagnosis not present

## 2017-12-20 DIAGNOSIS — R6882 Decreased libido: Secondary | ICD-10-CM | POA: Diagnosis not present

## 2018-01-13 DIAGNOSIS — J209 Acute bronchitis, unspecified: Secondary | ICD-10-CM | POA: Diagnosis not present

## 2018-05-15 DIAGNOSIS — Z79899 Other long term (current) drug therapy: Secondary | ICD-10-CM | POA: Diagnosis not present

## 2018-05-15 DIAGNOSIS — M109 Gout, unspecified: Secondary | ICD-10-CM | POA: Diagnosis not present

## 2018-05-15 DIAGNOSIS — R2689 Other abnormalities of gait and mobility: Secondary | ICD-10-CM | POA: Diagnosis not present

## 2018-05-15 DIAGNOSIS — R1110 Vomiting, unspecified: Secondary | ICD-10-CM | POA: Diagnosis not present

## 2018-05-15 DIAGNOSIS — R112 Nausea with vomiting, unspecified: Secondary | ICD-10-CM | POA: Diagnosis not present

## 2018-05-15 DIAGNOSIS — E119 Type 2 diabetes mellitus without complications: Secondary | ICD-10-CM | POA: Diagnosis not present

## 2018-05-15 DIAGNOSIS — Z9861 Coronary angioplasty status: Secondary | ICD-10-CM | POA: Diagnosis not present

## 2018-05-15 DIAGNOSIS — E872 Acidosis: Secondary | ICD-10-CM | POA: Diagnosis not present

## 2018-05-15 DIAGNOSIS — Z6836 Body mass index (BMI) 36.0-36.9, adult: Secondary | ICD-10-CM | POA: Diagnosis not present

## 2018-05-15 DIAGNOSIS — Z951 Presence of aortocoronary bypass graft: Secondary | ICD-10-CM | POA: Diagnosis not present

## 2018-05-15 DIAGNOSIS — Z0131 Encounter for examination of blood pressure with abnormal findings: Secondary | ICD-10-CM | POA: Diagnosis not present

## 2018-05-15 DIAGNOSIS — I25110 Atherosclerotic heart disease of native coronary artery with unstable angina pectoris: Secondary | ICD-10-CM | POA: Diagnosis not present

## 2018-05-15 DIAGNOSIS — R079 Chest pain, unspecified: Secondary | ICD-10-CM | POA: Diagnosis not present

## 2018-05-15 DIAGNOSIS — I200 Unstable angina: Secondary | ICD-10-CM | POA: Diagnosis not present

## 2018-05-15 DIAGNOSIS — R140 Abdominal distension (gaseous): Secondary | ICD-10-CM | POA: Diagnosis not present

## 2018-05-15 DIAGNOSIS — I25810 Atherosclerosis of coronary artery bypass graft(s) without angina pectoris: Secondary | ICD-10-CM | POA: Diagnosis not present

## 2018-05-15 DIAGNOSIS — E7849 Other hyperlipidemia: Secondary | ICD-10-CM | POA: Diagnosis not present

## 2018-05-15 DIAGNOSIS — J9811 Atelectasis: Secondary | ICD-10-CM | POA: Diagnosis not present

## 2018-05-15 DIAGNOSIS — R0602 Shortness of breath: Secondary | ICD-10-CM | POA: Diagnosis not present

## 2018-05-15 DIAGNOSIS — D62 Acute posthemorrhagic anemia: Secondary | ICD-10-CM | POA: Diagnosis not present

## 2018-05-15 DIAGNOSIS — Z01810 Encounter for preprocedural cardiovascular examination: Secondary | ICD-10-CM | POA: Diagnosis not present

## 2018-05-15 DIAGNOSIS — R0789 Other chest pain: Secondary | ICD-10-CM | POA: Diagnosis not present

## 2018-05-15 DIAGNOSIS — J90 Pleural effusion, not elsewhere classified: Secondary | ICD-10-CM | POA: Diagnosis not present

## 2018-05-15 DIAGNOSIS — Z955 Presence of coronary angioplasty implant and graft: Secondary | ICD-10-CM | POA: Diagnosis not present

## 2018-05-15 DIAGNOSIS — E785 Hyperlipidemia, unspecified: Secondary | ICD-10-CM | POA: Diagnosis not present

## 2018-05-15 DIAGNOSIS — K567 Ileus, unspecified: Secondary | ICD-10-CM | POA: Diagnosis not present

## 2018-05-15 DIAGNOSIS — F17211 Nicotine dependence, cigarettes, in remission: Secondary | ICD-10-CM | POA: Diagnosis not present

## 2018-05-15 DIAGNOSIS — I10 Essential (primary) hypertension: Secondary | ICD-10-CM | POA: Diagnosis not present

## 2018-05-15 DIAGNOSIS — I6523 Occlusion and stenosis of bilateral carotid arteries: Secondary | ICD-10-CM | POA: Diagnosis not present

## 2018-05-15 DIAGNOSIS — I499 Cardiac arrhythmia, unspecified: Secondary | ICD-10-CM | POA: Diagnosis not present

## 2018-05-15 DIAGNOSIS — Z87891 Personal history of nicotine dependence: Secondary | ICD-10-CM | POA: Diagnosis not present

## 2018-05-15 DIAGNOSIS — D696 Thrombocytopenia, unspecified: Secondary | ICD-10-CM | POA: Diagnosis not present

## 2018-05-15 DIAGNOSIS — I2510 Atherosclerotic heart disease of native coronary artery without angina pectoris: Secondary | ICD-10-CM | POA: Diagnosis not present

## 2018-05-15 DIAGNOSIS — R9431 Abnormal electrocardiogram [ECG] [EKG]: Secondary | ICD-10-CM | POA: Diagnosis not present

## 2018-05-15 DIAGNOSIS — Z8679 Personal history of other diseases of the circulatory system: Secondary | ICD-10-CM | POA: Diagnosis not present

## 2018-06-06 DIAGNOSIS — E119 Type 2 diabetes mellitus without complications: Secondary | ICD-10-CM | POA: Diagnosis not present

## 2018-07-07 DIAGNOSIS — Z951 Presence of aortocoronary bypass graft: Secondary | ICD-10-CM | POA: Diagnosis not present

## 2018-07-07 DIAGNOSIS — I10 Essential (primary) hypertension: Secondary | ICD-10-CM | POA: Diagnosis not present

## 2018-07-07 DIAGNOSIS — E7800 Pure hypercholesterolemia, unspecified: Secondary | ICD-10-CM | POA: Diagnosis not present

## 2018-07-07 DIAGNOSIS — I2510 Atherosclerotic heart disease of native coronary artery without angina pectoris: Secondary | ICD-10-CM | POA: Diagnosis not present

## 2018-07-11 DIAGNOSIS — R9431 Abnormal electrocardiogram [ECG] [EKG]: Secondary | ICD-10-CM | POA: Diagnosis not present

## 2018-07-19 DIAGNOSIS — R339 Retention of urine, unspecified: Secondary | ICD-10-CM | POA: Diagnosis not present

## 2018-07-19 DIAGNOSIS — Z125 Encounter for screening for malignant neoplasm of prostate: Secondary | ICD-10-CM | POA: Diagnosis not present

## 2018-07-19 DIAGNOSIS — N401 Enlarged prostate with lower urinary tract symptoms: Secondary | ICD-10-CM | POA: Diagnosis not present

## 2018-09-05 DIAGNOSIS — Z23 Encounter for immunization: Secondary | ICD-10-CM | POA: Diagnosis not present

## 2018-09-05 DIAGNOSIS — E119 Type 2 diabetes mellitus without complications: Secondary | ICD-10-CM | POA: Diagnosis not present

## 2018-09-05 DIAGNOSIS — Z79899 Other long term (current) drug therapy: Secondary | ICD-10-CM | POA: Diagnosis not present

## 2018-09-05 DIAGNOSIS — E7801 Familial hypercholesterolemia: Secondary | ICD-10-CM | POA: Diagnosis not present

## 2018-10-16 DIAGNOSIS — J209 Acute bronchitis, unspecified: Secondary | ICD-10-CM | POA: Diagnosis not present

## 2018-10-27 DIAGNOSIS — J209 Acute bronchitis, unspecified: Secondary | ICD-10-CM | POA: Diagnosis not present

## 2019-03-06 DIAGNOSIS — E7801 Familial hypercholesterolemia: Secondary | ICD-10-CM | POA: Diagnosis not present

## 2019-03-06 DIAGNOSIS — E119 Type 2 diabetes mellitus without complications: Secondary | ICD-10-CM | POA: Diagnosis not present

## 2019-03-06 DIAGNOSIS — Z79899 Other long term (current) drug therapy: Secondary | ICD-10-CM | POA: Diagnosis not present

## 2019-03-15 DIAGNOSIS — R740 Nonspecific elevation of levels of transaminase and lactic acid dehydrogenase [LDH]: Secondary | ICD-10-CM | POA: Diagnosis not present

## 2019-04-03 DIAGNOSIS — E7800 Pure hypercholesterolemia, unspecified: Secondary | ICD-10-CM | POA: Diagnosis not present

## 2019-04-03 DIAGNOSIS — I2510 Atherosclerotic heart disease of native coronary artery without angina pectoris: Secondary | ICD-10-CM | POA: Diagnosis not present

## 2019-04-03 DIAGNOSIS — I10 Essential (primary) hypertension: Secondary | ICD-10-CM | POA: Diagnosis not present

## 2019-04-03 DIAGNOSIS — Z951 Presence of aortocoronary bypass graft: Secondary | ICD-10-CM | POA: Diagnosis not present

## 2019-08-03 DIAGNOSIS — G4489 Other headache syndrome: Secondary | ICD-10-CM | POA: Diagnosis not present

## 2019-08-03 DIAGNOSIS — R0789 Other chest pain: Secondary | ICD-10-CM | POA: Diagnosis not present

## 2019-08-03 DIAGNOSIS — R0602 Shortness of breath: Secondary | ICD-10-CM | POA: Diagnosis not present

## 2019-08-03 DIAGNOSIS — R9431 Abnormal electrocardiogram [ECG] [EKG]: Secondary | ICD-10-CM | POA: Diagnosis not present

## 2019-08-03 DIAGNOSIS — R079 Chest pain, unspecified: Secondary | ICD-10-CM | POA: Diagnosis not present

## 2019-08-03 DIAGNOSIS — I10 Essential (primary) hypertension: Secondary | ICD-10-CM | POA: Diagnosis not present

## 2019-08-05 DIAGNOSIS — I498 Other specified cardiac arrhythmias: Secondary | ICD-10-CM | POA: Diagnosis not present

## 2019-08-05 DIAGNOSIS — R001 Bradycardia, unspecified: Secondary | ICD-10-CM | POA: Diagnosis not present

## 2019-08-07 DIAGNOSIS — E7800 Pure hypercholesterolemia, unspecified: Secondary | ICD-10-CM | POA: Diagnosis not present

## 2019-08-07 DIAGNOSIS — R29898 Other symptoms and signs involving the musculoskeletal system: Secondary | ICD-10-CM | POA: Diagnosis not present

## 2019-08-07 DIAGNOSIS — R0600 Dyspnea, unspecified: Secondary | ICD-10-CM | POA: Diagnosis not present

## 2019-08-07 DIAGNOSIS — I2510 Atherosclerotic heart disease of native coronary artery without angina pectoris: Secondary | ICD-10-CM | POA: Diagnosis not present

## 2019-08-07 DIAGNOSIS — Z951 Presence of aortocoronary bypass graft: Secondary | ICD-10-CM | POA: Diagnosis not present

## 2019-08-07 DIAGNOSIS — I10 Essential (primary) hypertension: Secondary | ICD-10-CM | POA: Diagnosis not present

## 2019-08-13 DIAGNOSIS — I10 Essential (primary) hypertension: Secondary | ICD-10-CM | POA: Diagnosis not present

## 2019-08-13 DIAGNOSIS — R531 Weakness: Secondary | ICD-10-CM | POA: Diagnosis not present

## 2019-08-13 DIAGNOSIS — R29898 Other symptoms and signs involving the musculoskeletal system: Secondary | ICD-10-CM | POA: Diagnosis not present

## 2019-08-14 DIAGNOSIS — R0600 Dyspnea, unspecified: Secondary | ICD-10-CM | POA: Diagnosis not present

## 2019-08-14 DIAGNOSIS — E7800 Pure hypercholesterolemia, unspecified: Secondary | ICD-10-CM | POA: Diagnosis not present

## 2019-08-14 DIAGNOSIS — I2510 Atherosclerotic heart disease of native coronary artery without angina pectoris: Secondary | ICD-10-CM | POA: Diagnosis not present

## 2019-09-04 DIAGNOSIS — E7801 Familial hypercholesterolemia: Secondary | ICD-10-CM | POA: Diagnosis not present

## 2019-09-04 DIAGNOSIS — Z951 Presence of aortocoronary bypass graft: Secondary | ICD-10-CM | POA: Diagnosis not present

## 2019-09-04 DIAGNOSIS — I10 Essential (primary) hypertension: Secondary | ICD-10-CM | POA: Diagnosis not present

## 2019-09-04 DIAGNOSIS — I2510 Atherosclerotic heart disease of native coronary artery without angina pectoris: Secondary | ICD-10-CM | POA: Diagnosis not present

## 2019-09-04 DIAGNOSIS — R0600 Dyspnea, unspecified: Secondary | ICD-10-CM | POA: Diagnosis not present

## 2019-09-04 DIAGNOSIS — Z79899 Other long term (current) drug therapy: Secondary | ICD-10-CM | POA: Diagnosis not present

## 2019-09-04 DIAGNOSIS — E7800 Pure hypercholesterolemia, unspecified: Secondary | ICD-10-CM | POA: Diagnosis not present

## 2019-09-04 DIAGNOSIS — E119 Type 2 diabetes mellitus without complications: Secondary | ICD-10-CM | POA: Diagnosis not present

## 2019-09-25 DIAGNOSIS — J209 Acute bronchitis, unspecified: Secondary | ICD-10-CM | POA: Diagnosis not present

## 2019-09-25 DIAGNOSIS — U071 COVID-19: Secondary | ICD-10-CM | POA: Diagnosis not present

## 2019-10-08 DIAGNOSIS — M109 Gout, unspecified: Secondary | ICD-10-CM | POA: Diagnosis not present

## 2019-10-08 DIAGNOSIS — E119 Type 2 diabetes mellitus without complications: Secondary | ICD-10-CM | POA: Diagnosis not present

## 2019-10-08 DIAGNOSIS — R1084 Generalized abdominal pain: Secondary | ICD-10-CM | POA: Diagnosis not present

## 2019-10-08 DIAGNOSIS — I2510 Atherosclerotic heart disease of native coronary artery without angina pectoris: Secondary | ICD-10-CM | POA: Diagnosis not present

## 2019-10-08 DIAGNOSIS — J9601 Acute respiratory failure with hypoxia: Secondary | ICD-10-CM | POA: Diagnosis not present

## 2019-10-08 DIAGNOSIS — R0602 Shortness of breath: Secondary | ICD-10-CM | POA: Diagnosis not present

## 2019-10-08 DIAGNOSIS — I10 Essential (primary) hypertension: Secondary | ICD-10-CM | POA: Diagnosis not present

## 2019-10-08 DIAGNOSIS — R0902 Hypoxemia: Secondary | ICD-10-CM | POA: Diagnosis not present

## 2019-10-08 DIAGNOSIS — Z955 Presence of coronary angioplasty implant and graft: Secondary | ICD-10-CM | POA: Diagnosis not present

## 2019-10-08 DIAGNOSIS — R197 Diarrhea, unspecified: Secondary | ICD-10-CM | POA: Diagnosis not present

## 2019-10-08 DIAGNOSIS — Z79899 Other long term (current) drug therapy: Secondary | ICD-10-CM | POA: Diagnosis not present

## 2019-10-08 DIAGNOSIS — E860 Dehydration: Secondary | ICD-10-CM | POA: Diagnosis not present

## 2019-10-08 DIAGNOSIS — J1282 Pneumonia due to coronavirus disease 2019: Secondary | ICD-10-CM | POA: Diagnosis not present

## 2019-10-08 DIAGNOSIS — R3912 Poor urinary stream: Secondary | ICD-10-CM | POA: Diagnosis not present

## 2019-10-08 DIAGNOSIS — R110 Nausea: Secondary | ICD-10-CM | POA: Diagnosis not present

## 2019-10-08 DIAGNOSIS — U071 COVID-19: Secondary | ICD-10-CM | POA: Diagnosis not present

## 2019-10-08 DIAGNOSIS — Z885 Allergy status to narcotic agent status: Secondary | ICD-10-CM | POA: Diagnosis not present

## 2019-10-08 DIAGNOSIS — Z951 Presence of aortocoronary bypass graft: Secondary | ICD-10-CM | POA: Diagnosis not present

## 2019-10-08 DIAGNOSIS — Z7982 Long term (current) use of aspirin: Secondary | ICD-10-CM | POA: Diagnosis not present

## 2019-10-08 DIAGNOSIS — A0839 Other viral enteritis: Secondary | ICD-10-CM | POA: Diagnosis not present

## 2019-10-08 DIAGNOSIS — R109 Unspecified abdominal pain: Secondary | ICD-10-CM | POA: Diagnosis not present

## 2019-10-08 DIAGNOSIS — R52 Pain, unspecified: Secondary | ICD-10-CM | POA: Diagnosis not present

## 2019-10-08 DIAGNOSIS — E785 Hyperlipidemia, unspecified: Secondary | ICD-10-CM | POA: Diagnosis not present

## 2019-10-08 DIAGNOSIS — Z87891 Personal history of nicotine dependence: Secondary | ICD-10-CM | POA: Diagnosis not present

## 2019-11-01 DIAGNOSIS — R0602 Shortness of breath: Secondary | ICD-10-CM | POA: Diagnosis not present

## 2019-11-01 DIAGNOSIS — U071 COVID-19: Secondary | ICD-10-CM | POA: Diagnosis not present

## 2019-11-01 DIAGNOSIS — R197 Diarrhea, unspecified: Secondary | ICD-10-CM | POA: Diagnosis not present

## 2019-11-01 DIAGNOSIS — R112 Nausea with vomiting, unspecified: Secondary | ICD-10-CM | POA: Diagnosis not present

## 2019-11-02 DIAGNOSIS — R197 Diarrhea, unspecified: Secondary | ICD-10-CM | POA: Diagnosis not present

## 2019-11-05 DIAGNOSIS — Z23 Encounter for immunization: Secondary | ICD-10-CM | POA: Diagnosis not present

## 2019-11-05 DIAGNOSIS — R1031 Right lower quadrant pain: Secondary | ICD-10-CM | POA: Diagnosis not present

## 2019-11-05 DIAGNOSIS — I2510 Atherosclerotic heart disease of native coronary artery without angina pectoris: Secondary | ICD-10-CM | POA: Diagnosis not present

## 2019-11-05 DIAGNOSIS — Z7982 Long term (current) use of aspirin: Secondary | ICD-10-CM | POA: Diagnosis not present

## 2019-11-05 DIAGNOSIS — K5900 Constipation, unspecified: Secondary | ICD-10-CM | POA: Diagnosis not present

## 2019-11-05 DIAGNOSIS — J449 Chronic obstructive pulmonary disease, unspecified: Secondary | ICD-10-CM | POA: Diagnosis not present

## 2019-11-05 DIAGNOSIS — Z885 Allergy status to narcotic agent status: Secondary | ICD-10-CM | POA: Diagnosis not present

## 2019-11-05 DIAGNOSIS — I252 Old myocardial infarction: Secondary | ICD-10-CM | POA: Diagnosis not present

## 2019-11-05 DIAGNOSIS — I10 Essential (primary) hypertension: Secondary | ICD-10-CM | POA: Diagnosis not present

## 2019-11-05 DIAGNOSIS — Z79899 Other long term (current) drug therapy: Secondary | ICD-10-CM | POA: Diagnosis not present

## 2019-11-05 DIAGNOSIS — R109 Unspecified abdominal pain: Secondary | ICD-10-CM | POA: Diagnosis not present

## 2019-11-05 DIAGNOSIS — R110 Nausea: Secondary | ICD-10-CM | POA: Diagnosis not present

## 2019-11-05 DIAGNOSIS — L03311 Cellulitis of abdominal wall: Secondary | ICD-10-CM | POA: Diagnosis not present

## 2019-11-05 DIAGNOSIS — R140 Abdominal distension (gaseous): Secondary | ICD-10-CM | POA: Diagnosis not present

## 2019-11-05 DIAGNOSIS — E119 Type 2 diabetes mellitus without complications: Secondary | ICD-10-CM | POA: Diagnosis not present

## 2019-11-05 DIAGNOSIS — K760 Fatty (change of) liver, not elsewhere classified: Secondary | ICD-10-CM | POA: Diagnosis not present

## 2019-11-05 DIAGNOSIS — Z87891 Personal history of nicotine dependence: Secondary | ICD-10-CM | POA: Diagnosis not present

## 2019-11-05 DIAGNOSIS — K5641 Fecal impaction: Secondary | ICD-10-CM | POA: Diagnosis not present

## 2019-11-05 DIAGNOSIS — M109 Gout, unspecified: Secondary | ICD-10-CM | POA: Diagnosis not present

## 2019-11-16 DIAGNOSIS — M109 Gout, unspecified: Secondary | ICD-10-CM | POA: Diagnosis not present

## 2019-11-16 DIAGNOSIS — R7303 Prediabetes: Secondary | ICD-10-CM | POA: Diagnosis not present

## 2019-11-16 DIAGNOSIS — I9589 Other hypotension: Secondary | ICD-10-CM | POA: Diagnosis not present

## 2019-11-16 DIAGNOSIS — Z79899 Other long term (current) drug therapy: Secondary | ICD-10-CM | POA: Diagnosis not present

## 2019-11-16 DIAGNOSIS — I2510 Atherosclerotic heart disease of native coronary artery without angina pectoris: Secondary | ICD-10-CM | POA: Diagnosis not present

## 2019-11-16 DIAGNOSIS — I959 Hypotension, unspecified: Secondary | ICD-10-CM | POA: Diagnosis not present

## 2019-11-16 DIAGNOSIS — K5641 Fecal impaction: Secondary | ICD-10-CM | POA: Diagnosis not present

## 2019-11-16 DIAGNOSIS — Z951 Presence of aortocoronary bypass graft: Secondary | ICD-10-CM | POA: Diagnosis not present

## 2019-11-16 DIAGNOSIS — I252 Old myocardial infarction: Secondary | ICD-10-CM | POA: Diagnosis not present

## 2019-11-16 DIAGNOSIS — Z87891 Personal history of nicotine dependence: Secondary | ICD-10-CM | POA: Diagnosis not present

## 2019-11-16 DIAGNOSIS — E860 Dehydration: Secondary | ICD-10-CM | POA: Diagnosis not present

## 2019-11-16 DIAGNOSIS — L03319 Cellulitis of trunk, unspecified: Secondary | ICD-10-CM

## 2019-11-16 DIAGNOSIS — N289 Disorder of kidney and ureter, unspecified: Secondary | ICD-10-CM | POA: Diagnosis not present

## 2019-11-16 DIAGNOSIS — L03311 Cellulitis of abdominal wall: Secondary | ICD-10-CM | POA: Diagnosis not present

## 2019-11-16 DIAGNOSIS — Z7984 Long term (current) use of oral hypoglycemic drugs: Secondary | ICD-10-CM | POA: Diagnosis not present

## 2019-11-16 DIAGNOSIS — I10 Essential (primary) hypertension: Secondary | ICD-10-CM | POA: Diagnosis not present

## 2019-11-16 DIAGNOSIS — Z23 Encounter for immunization: Secondary | ICD-10-CM | POA: Diagnosis not present

## 2019-11-23 DIAGNOSIS — E669 Obesity, unspecified: Secondary | ICD-10-CM | POA: Diagnosis not present

## 2019-11-23 DIAGNOSIS — K760 Fatty (change of) liver, not elsewhere classified: Secondary | ICD-10-CM | POA: Diagnosis not present

## 2019-11-23 DIAGNOSIS — M109 Gout, unspecified: Secondary | ICD-10-CM | POA: Diagnosis not present

## 2019-11-23 DIAGNOSIS — L03311 Cellulitis of abdominal wall: Secondary | ICD-10-CM | POA: Diagnosis not present

## 2019-11-23 DIAGNOSIS — I700 Atherosclerosis of aorta: Secondary | ICD-10-CM | POA: Diagnosis not present

## 2019-11-23 DIAGNOSIS — N179 Acute kidney failure, unspecified: Secondary | ICD-10-CM | POA: Diagnosis not present

## 2019-11-23 DIAGNOSIS — Z7982 Long term (current) use of aspirin: Secondary | ICD-10-CM | POA: Diagnosis not present

## 2019-11-23 DIAGNOSIS — Z87891 Personal history of nicotine dependence: Secondary | ICD-10-CM | POA: Diagnosis not present

## 2019-11-23 DIAGNOSIS — I252 Old myocardial infarction: Secondary | ICD-10-CM | POA: Diagnosis not present

## 2019-11-23 DIAGNOSIS — Z8701 Personal history of pneumonia (recurrent): Secondary | ICD-10-CM | POA: Diagnosis not present

## 2019-11-23 DIAGNOSIS — I10 Essential (primary) hypertension: Secondary | ICD-10-CM | POA: Diagnosis not present

## 2019-11-23 DIAGNOSIS — Z7984 Long term (current) use of oral hypoglycemic drugs: Secondary | ICD-10-CM | POA: Diagnosis not present

## 2019-11-23 DIAGNOSIS — R634 Abnormal weight loss: Secondary | ICD-10-CM | POA: Diagnosis not present

## 2019-11-23 DIAGNOSIS — I2510 Atherosclerotic heart disease of native coronary artery without angina pectoris: Secondary | ICD-10-CM | POA: Diagnosis not present

## 2019-11-23 DIAGNOSIS — Z6830 Body mass index (BMI) 30.0-30.9, adult: Secondary | ICD-10-CM | POA: Diagnosis not present

## 2019-11-23 DIAGNOSIS — Z951 Presence of aortocoronary bypass graft: Secondary | ICD-10-CM | POA: Diagnosis not present

## 2019-11-23 DIAGNOSIS — E119 Type 2 diabetes mellitus without complications: Secondary | ICD-10-CM | POA: Diagnosis not present

## 2019-11-23 DIAGNOSIS — Z9181 History of falling: Secondary | ICD-10-CM | POA: Diagnosis not present

## 2019-11-29 DIAGNOSIS — L03311 Cellulitis of abdominal wall: Secondary | ICD-10-CM | POA: Diagnosis not present

## 2019-12-05 DIAGNOSIS — R109 Unspecified abdominal pain: Secondary | ICD-10-CM | POA: Diagnosis not present

## 2019-12-05 DIAGNOSIS — K5900 Constipation, unspecified: Secondary | ICD-10-CM | POA: Diagnosis not present

## 2019-12-05 DIAGNOSIS — R112 Nausea with vomiting, unspecified: Secondary | ICD-10-CM | POA: Diagnosis not present

## 2019-12-11 DIAGNOSIS — K828 Other specified diseases of gallbladder: Secondary | ICD-10-CM | POA: Diagnosis not present

## 2019-12-11 DIAGNOSIS — R112 Nausea with vomiting, unspecified: Secondary | ICD-10-CM | POA: Diagnosis not present

## 2019-12-20 DIAGNOSIS — R109 Unspecified abdominal pain: Secondary | ICD-10-CM | POA: Diagnosis not present

## 2019-12-20 DIAGNOSIS — I10 Essential (primary) hypertension: Secondary | ICD-10-CM | POA: Diagnosis not present

## 2019-12-20 DIAGNOSIS — R1310 Dysphagia, unspecified: Secondary | ICD-10-CM | POA: Diagnosis not present

## 2019-12-20 DIAGNOSIS — E119 Type 2 diabetes mellitus without complications: Secondary | ICD-10-CM | POA: Diagnosis not present

## 2019-12-20 DIAGNOSIS — K5900 Constipation, unspecified: Secondary | ICD-10-CM | POA: Diagnosis not present

## 2019-12-20 DIAGNOSIS — K8010 Calculus of gallbladder with chronic cholecystitis without obstruction: Secondary | ICD-10-CM | POA: Diagnosis not present

## 2019-12-20 DIAGNOSIS — R1011 Right upper quadrant pain: Secondary | ICD-10-CM | POA: Diagnosis not present

## 2019-12-23 DIAGNOSIS — Z7984 Long term (current) use of oral hypoglycemic drugs: Secondary | ICD-10-CM | POA: Diagnosis not present

## 2019-12-23 DIAGNOSIS — E669 Obesity, unspecified: Secondary | ICD-10-CM | POA: Diagnosis not present

## 2019-12-23 DIAGNOSIS — L03311 Cellulitis of abdominal wall: Secondary | ICD-10-CM | POA: Diagnosis not present

## 2019-12-23 DIAGNOSIS — Z7982 Long term (current) use of aspirin: Secondary | ICD-10-CM | POA: Diagnosis not present

## 2019-12-23 DIAGNOSIS — K760 Fatty (change of) liver, not elsewhere classified: Secondary | ICD-10-CM | POA: Diagnosis not present

## 2019-12-23 DIAGNOSIS — I10 Essential (primary) hypertension: Secondary | ICD-10-CM | POA: Diagnosis not present

## 2019-12-23 DIAGNOSIS — N179 Acute kidney failure, unspecified: Secondary | ICD-10-CM | POA: Diagnosis not present

## 2019-12-23 DIAGNOSIS — Z9181 History of falling: Secondary | ICD-10-CM | POA: Diagnosis not present

## 2019-12-23 DIAGNOSIS — Z951 Presence of aortocoronary bypass graft: Secondary | ICD-10-CM | POA: Diagnosis not present

## 2019-12-23 DIAGNOSIS — R634 Abnormal weight loss: Secondary | ICD-10-CM | POA: Diagnosis not present

## 2019-12-23 DIAGNOSIS — Z6830 Body mass index (BMI) 30.0-30.9, adult: Secondary | ICD-10-CM | POA: Diagnosis not present

## 2019-12-23 DIAGNOSIS — Z87891 Personal history of nicotine dependence: Secondary | ICD-10-CM | POA: Diagnosis not present

## 2019-12-23 DIAGNOSIS — Z8701 Personal history of pneumonia (recurrent): Secondary | ICD-10-CM | POA: Diagnosis not present

## 2019-12-23 DIAGNOSIS — I2510 Atherosclerotic heart disease of native coronary artery without angina pectoris: Secondary | ICD-10-CM | POA: Diagnosis not present

## 2019-12-23 DIAGNOSIS — I700 Atherosclerosis of aorta: Secondary | ICD-10-CM | POA: Diagnosis not present

## 2019-12-23 DIAGNOSIS — E119 Type 2 diabetes mellitus without complications: Secondary | ICD-10-CM | POA: Diagnosis not present

## 2019-12-23 DIAGNOSIS — I252 Old myocardial infarction: Secondary | ICD-10-CM | POA: Diagnosis not present

## 2019-12-23 DIAGNOSIS — M109 Gout, unspecified: Secondary | ICD-10-CM | POA: Diagnosis not present

## 2019-12-31 DIAGNOSIS — R197 Diarrhea, unspecified: Secondary | ICD-10-CM | POA: Diagnosis not present

## 2019-12-31 DIAGNOSIS — K811 Chronic cholecystitis: Secondary | ICD-10-CM | POA: Diagnosis not present

## 2019-12-31 DIAGNOSIS — K660 Peritoneal adhesions (postprocedural) (postinfection): Secondary | ICD-10-CM | POA: Diagnosis not present

## 2019-12-31 DIAGNOSIS — K915 Postcholecystectomy syndrome: Secondary | ICD-10-CM | POA: Diagnosis not present

## 2019-12-31 DIAGNOSIS — E119 Type 2 diabetes mellitus without complications: Secondary | ICD-10-CM | POA: Diagnosis not present

## 2019-12-31 DIAGNOSIS — K819 Cholecystitis, unspecified: Secondary | ICD-10-CM | POA: Diagnosis not present

## 2019-12-31 DIAGNOSIS — Z8616 Personal history of COVID-19: Secondary | ICD-10-CM | POA: Diagnosis not present

## 2019-12-31 DIAGNOSIS — I10 Essential (primary) hypertension: Secondary | ICD-10-CM | POA: Diagnosis not present

## 2019-12-31 DIAGNOSIS — Z8619 Personal history of other infectious and parasitic diseases: Secondary | ICD-10-CM | POA: Diagnosis not present

## 2019-12-31 DIAGNOSIS — K8010 Calculus of gallbladder with chronic cholecystitis without obstruction: Secondary | ICD-10-CM | POA: Diagnosis not present

## 2020-03-11 DIAGNOSIS — I2510 Atherosclerotic heart disease of native coronary artery without angina pectoris: Secondary | ICD-10-CM | POA: Diagnosis not present

## 2020-03-11 DIAGNOSIS — E7800 Pure hypercholesterolemia, unspecified: Secondary | ICD-10-CM | POA: Diagnosis not present

## 2020-03-11 DIAGNOSIS — Z951 Presence of aortocoronary bypass graft: Secondary | ICD-10-CM | POA: Diagnosis not present

## 2020-03-11 DIAGNOSIS — E119 Type 2 diabetes mellitus without complications: Secondary | ICD-10-CM | POA: Diagnosis not present

## 2020-03-11 DIAGNOSIS — I10 Essential (primary) hypertension: Secondary | ICD-10-CM | POA: Diagnosis not present

## 2020-03-11 DIAGNOSIS — R0600 Dyspnea, unspecified: Secondary | ICD-10-CM | POA: Diagnosis not present

## 2020-04-30 DIAGNOSIS — I10 Essential (primary) hypertension: Secondary | ICD-10-CM | POA: Diagnosis not present

## 2020-04-30 DIAGNOSIS — Z79899 Other long term (current) drug therapy: Secondary | ICD-10-CM | POA: Diagnosis not present

## 2020-04-30 DIAGNOSIS — E119 Type 2 diabetes mellitus without complications: Secondary | ICD-10-CM | POA: Diagnosis not present

## 2020-04-30 DIAGNOSIS — E7801 Familial hypercholesterolemia: Secondary | ICD-10-CM | POA: Diagnosis not present

## 2020-05-14 DIAGNOSIS — E7801 Familial hypercholesterolemia: Secondary | ICD-10-CM | POA: Diagnosis not present

## 2020-05-14 DIAGNOSIS — E119 Type 2 diabetes mellitus without complications: Secondary | ICD-10-CM | POA: Diagnosis not present

## 2020-06-26 DIAGNOSIS — R0602 Shortness of breath: Secondary | ICD-10-CM | POA: Diagnosis not present

## 2020-06-26 DIAGNOSIS — J449 Chronic obstructive pulmonary disease, unspecified: Secondary | ICD-10-CM | POA: Diagnosis not present

## 2020-06-26 DIAGNOSIS — I10 Essential (primary) hypertension: Secondary | ICD-10-CM | POA: Diagnosis not present

## 2020-06-26 DIAGNOSIS — R0789 Other chest pain: Secondary | ICD-10-CM | POA: Diagnosis not present

## 2020-06-26 DIAGNOSIS — E669 Obesity, unspecified: Secondary | ICD-10-CM | POA: Diagnosis not present

## 2020-06-26 DIAGNOSIS — I517 Cardiomegaly: Secondary | ICD-10-CM | POA: Diagnosis not present

## 2020-06-26 DIAGNOSIS — I252 Old myocardial infarction: Secondary | ICD-10-CM | POA: Diagnosis not present

## 2020-06-26 DIAGNOSIS — Z6834 Body mass index (BMI) 34.0-34.9, adult: Secondary | ICD-10-CM | POA: Diagnosis not present

## 2020-06-26 DIAGNOSIS — I2510 Atherosclerotic heart disease of native coronary artery without angina pectoris: Secondary | ICD-10-CM | POA: Diagnosis not present

## 2020-06-26 DIAGNOSIS — R079 Chest pain, unspecified: Secondary | ICD-10-CM | POA: Diagnosis not present

## 2020-06-26 DIAGNOSIS — Z7982 Long term (current) use of aspirin: Secondary | ICD-10-CM | POA: Diagnosis not present

## 2020-06-26 DIAGNOSIS — E119 Type 2 diabetes mellitus without complications: Secondary | ICD-10-CM | POA: Diagnosis not present

## 2020-06-26 DIAGNOSIS — E785 Hyperlipidemia, unspecified: Secondary | ICD-10-CM | POA: Diagnosis not present

## 2020-06-26 DIAGNOSIS — R0600 Dyspnea, unspecified: Secondary | ICD-10-CM | POA: Diagnosis not present

## 2020-06-26 DIAGNOSIS — Z951 Presence of aortocoronary bypass graft: Secondary | ICD-10-CM | POA: Diagnosis not present

## 2020-06-26 DIAGNOSIS — Z955 Presence of coronary angioplasty implant and graft: Secondary | ICD-10-CM | POA: Diagnosis not present

## 2020-06-26 DIAGNOSIS — Z87891 Personal history of nicotine dependence: Secondary | ICD-10-CM | POA: Diagnosis not present

## 2020-06-27 DIAGNOSIS — I2510 Atherosclerotic heart disease of native coronary artery without angina pectoris: Secondary | ICD-10-CM | POA: Diagnosis not present

## 2020-06-27 DIAGNOSIS — J449 Chronic obstructive pulmonary disease, unspecified: Secondary | ICD-10-CM | POA: Diagnosis not present

## 2020-06-27 DIAGNOSIS — E119 Type 2 diabetes mellitus without complications: Secondary | ICD-10-CM | POA: Diagnosis not present

## 2020-06-27 DIAGNOSIS — I517 Cardiomegaly: Secondary | ICD-10-CM | POA: Diagnosis not present

## 2020-06-27 DIAGNOSIS — I10 Essential (primary) hypertension: Secondary | ICD-10-CM | POA: Diagnosis not present

## 2020-06-27 DIAGNOSIS — Z951 Presence of aortocoronary bypass graft: Secondary | ICD-10-CM | POA: Diagnosis not present

## 2020-06-27 DIAGNOSIS — E669 Obesity, unspecified: Secondary | ICD-10-CM | POA: Diagnosis not present

## 2020-06-27 DIAGNOSIS — R079 Chest pain, unspecified: Secondary | ICD-10-CM | POA: Diagnosis not present

## 2020-06-27 DIAGNOSIS — E785 Hyperlipidemia, unspecified: Secondary | ICD-10-CM

## 2020-07-11 DIAGNOSIS — I25118 Atherosclerotic heart disease of native coronary artery with other forms of angina pectoris: Secondary | ICD-10-CM | POA: Diagnosis not present

## 2020-07-11 DIAGNOSIS — E785 Hyperlipidemia, unspecified: Secondary | ICD-10-CM | POA: Diagnosis not present

## 2020-07-11 DIAGNOSIS — Z951 Presence of aortocoronary bypass graft: Secondary | ICD-10-CM | POA: Diagnosis not present

## 2020-07-11 DIAGNOSIS — Z7982 Long term (current) use of aspirin: Secondary | ICD-10-CM | POA: Diagnosis not present

## 2020-07-11 DIAGNOSIS — I10 Essential (primary) hypertension: Secondary | ICD-10-CM | POA: Diagnosis not present

## 2020-07-30 DIAGNOSIS — E7801 Familial hypercholesterolemia: Secondary | ICD-10-CM | POA: Diagnosis not present

## 2020-07-30 DIAGNOSIS — R0789 Other chest pain: Secondary | ICD-10-CM | POA: Diagnosis not present

## 2020-09-30 DIAGNOSIS — E7801 Familial hypercholesterolemia: Secondary | ICD-10-CM | POA: Diagnosis not present

## 2020-09-30 DIAGNOSIS — I10 Essential (primary) hypertension: Secondary | ICD-10-CM | POA: Diagnosis not present

## 2020-10-28 DIAGNOSIS — R0600 Dyspnea, unspecified: Secondary | ICD-10-CM | POA: Diagnosis not present

## 2020-10-28 DIAGNOSIS — I10 Essential (primary) hypertension: Secondary | ICD-10-CM | POA: Diagnosis not present

## 2020-10-28 DIAGNOSIS — E785 Hyperlipidemia, unspecified: Secondary | ICD-10-CM | POA: Diagnosis not present

## 2020-10-28 DIAGNOSIS — I2510 Atherosclerotic heart disease of native coronary artery without angina pectoris: Secondary | ICD-10-CM | POA: Diagnosis not present

## 2020-10-28 DIAGNOSIS — E119 Type 2 diabetes mellitus without complications: Secondary | ICD-10-CM | POA: Diagnosis not present

## 2020-10-28 DIAGNOSIS — Z951 Presence of aortocoronary bypass graft: Secondary | ICD-10-CM | POA: Diagnosis not present

## 2020-11-17 DIAGNOSIS — L603 Nail dystrophy: Secondary | ICD-10-CM | POA: Diagnosis not present

## 2020-11-17 DIAGNOSIS — E119 Type 2 diabetes mellitus without complications: Secondary | ICD-10-CM | POA: Diagnosis not present

## 2020-11-17 DIAGNOSIS — R202 Paresthesia of skin: Secondary | ICD-10-CM | POA: Diagnosis not present

## 2020-12-30 DIAGNOSIS — Z79899 Other long term (current) drug therapy: Secondary | ICD-10-CM | POA: Diagnosis not present

## 2020-12-30 DIAGNOSIS — I10 Essential (primary) hypertension: Secondary | ICD-10-CM | POA: Diagnosis not present

## 2020-12-30 DIAGNOSIS — E119 Type 2 diabetes mellitus without complications: Secondary | ICD-10-CM | POA: Diagnosis not present

## 2020-12-30 DIAGNOSIS — E7801 Familial hypercholesterolemia: Secondary | ICD-10-CM | POA: Diagnosis not present

## 2021-01-14 DIAGNOSIS — J069 Acute upper respiratory infection, unspecified: Secondary | ICD-10-CM | POA: Diagnosis not present

## 2021-01-14 DIAGNOSIS — Z20828 Contact with and (suspected) exposure to other viral communicable diseases: Secondary | ICD-10-CM | POA: Diagnosis not present

## 2021-07-02 DIAGNOSIS — E7801 Familial hypercholesterolemia: Secondary | ICD-10-CM | POA: Diagnosis not present

## 2021-07-02 DIAGNOSIS — Z79899 Other long term (current) drug therapy: Secondary | ICD-10-CM | POA: Diagnosis not present

## 2021-07-02 DIAGNOSIS — I10 Essential (primary) hypertension: Secondary | ICD-10-CM | POA: Diagnosis not present

## 2021-07-02 DIAGNOSIS — E119 Type 2 diabetes mellitus without complications: Secondary | ICD-10-CM | POA: Diagnosis not present

## 2021-08-03 DIAGNOSIS — E785 Hyperlipidemia, unspecified: Secondary | ICD-10-CM | POA: Diagnosis not present

## 2021-08-03 DIAGNOSIS — E1169 Type 2 diabetes mellitus with other specified complication: Secondary | ICD-10-CM | POA: Diagnosis not present

## 2021-08-03 DIAGNOSIS — Z87891 Personal history of nicotine dependence: Secondary | ICD-10-CM | POA: Diagnosis not present

## 2021-08-03 DIAGNOSIS — I10 Essential (primary) hypertension: Secondary | ICD-10-CM | POA: Diagnosis not present

## 2021-08-03 DIAGNOSIS — Z951 Presence of aortocoronary bypass graft: Secondary | ICD-10-CM | POA: Diagnosis not present

## 2021-08-03 DIAGNOSIS — I2510 Atherosclerotic heart disease of native coronary artery without angina pectoris: Secondary | ICD-10-CM | POA: Diagnosis not present

## 2021-08-03 DIAGNOSIS — Z7982 Long term (current) use of aspirin: Secondary | ICD-10-CM | POA: Diagnosis not present

## 2022-12-14 ENCOUNTER — Telehealth: Payer: Self-pay

## 2022-12-14 NOTE — Telephone Encounter (Signed)
        Patient  visited [facility] on 3/19    Telephone encounter attempt : 1st   A HIPAA compliant voice message was left requesting a return call.  Instructed patient to call back .   [facility] [phone] [address], [place], [place] [address] Phone: [phone] Email: [email or web address]

## 2022-12-15 ENCOUNTER — Telehealth: Payer: Self-pay

## 2022-12-15 NOTE — Telephone Encounter (Signed)
     Patient  visit on 3/19  at [facility]   Have you been able to follow up with your primary care physician? No   The patient was or was not able to obtain any needed medicine or equipment. Na   Are there diet recommendations that you are having difficulty following? Na   Patient expresses understanding of discharge instructions and education provided has no other needs at this time. Yes       [facility] [phone] [address], [place],  [address] Phone: [phone] Email: [email or web address]
